# Patient Record
Sex: Female | Born: 1995 | Race: Black or African American | Hispanic: No | Marital: Single | Smoking: Never smoker
Health system: Southern US, Community
[De-identification: ages and names within clinical notes are randomized; demographics above are authoritative.]

## PROBLEM LIST (undated history)

## (undated) DIAGNOSIS — I10 Essential (primary) hypertension: Secondary | ICD-10-CM

## (undated) DIAGNOSIS — D649 Anemia, unspecified: Secondary | ICD-10-CM

## (undated) HISTORY — PX: TONSILLECTOMY: SHX5217

## (undated) HISTORY — DX: Anemia, unspecified: D64.9

## (undated) HISTORY — DX: Essential (primary) hypertension: I10

## (undated) HISTORY — PX: TONSILLECTOMY: SUR1361

---
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
## 2014-04-28 ENCOUNTER — Ambulatory Visit: Payer: Self-pay | Admitting: Physician Assistant

## 2014-04-28 LAB — RAPID STREP-A WITH REFLX: MICRO TEXT REPORT: NEGATIVE

## 2014-05-01 LAB — BETA STREP CULTURE(ARMC)

## 2014-08-08 IMAGING — CR DG CHEST 1V PORT
1 series · 1 of 1 positions shown · non-contrast
Comparison: March 30, 2012

CLINICAL DATA: Chest pain

EXAM:
PORTABLE CHEST - 1 VIEW

[ap]
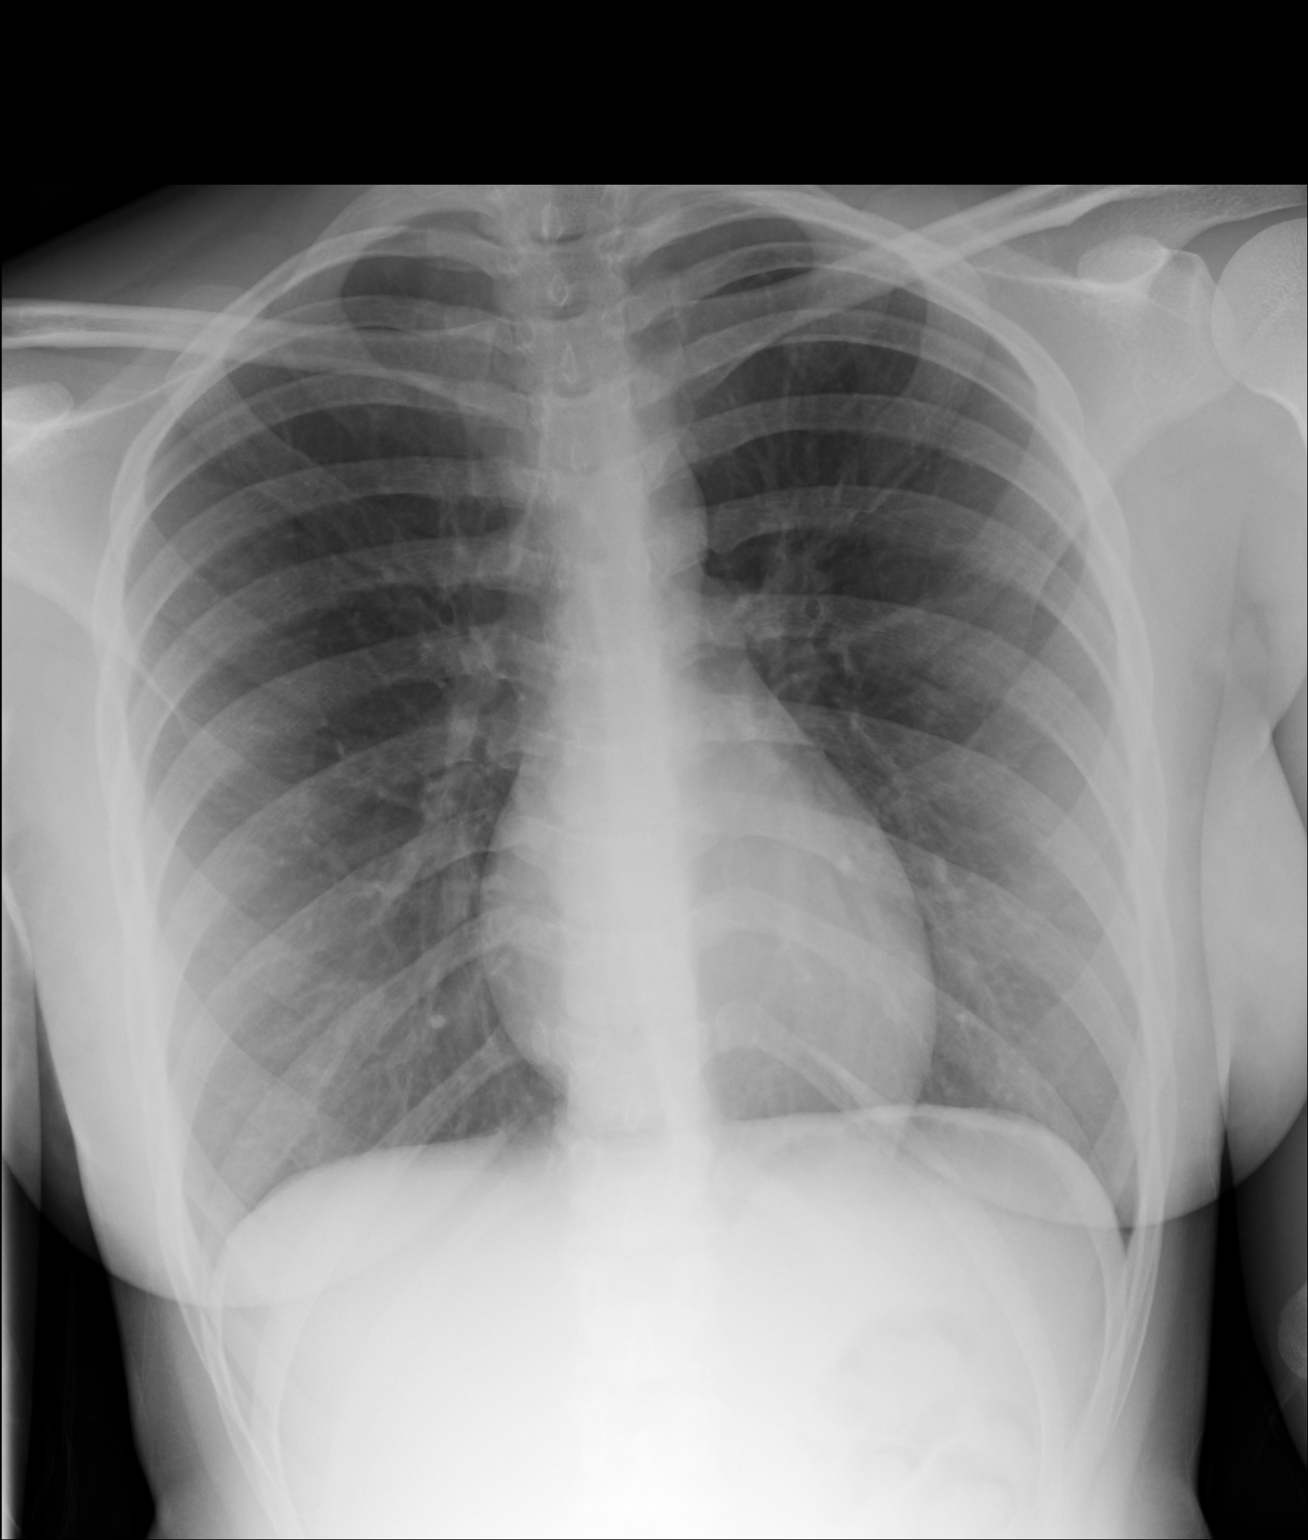

[1 of 1 positions shown; findings below may reference images not displayed]

FINDINGS: The heart size and mediastinal contours are within normal limits.
Both lungs are clear. There is no focal infiltrate, pulmonary edema,
or pleural effusion. The visualized skeletal structures are
unremarkable.
IMPRESSION: No active cardiopulmonary disease.

## 2015-03-19 ENCOUNTER — Encounter: Payer: Self-pay | Admitting: Physician Assistant

## 2015-03-19 ENCOUNTER — Ambulatory Visit (INDEPENDENT_AMBULATORY_CARE_PROVIDER_SITE_OTHER): Payer: Managed Care, Other (non HMO) | Admitting: Physician Assistant

## 2015-03-19 VITALS — BP 125/85 | HR 94 | Resp 18 | Ht 63.75 in | Wt 120.0 lb

## 2015-03-19 DIAGNOSIS — N912 Amenorrhea, unspecified: Secondary | ICD-10-CM

## 2015-03-19 DIAGNOSIS — Z3009 Encounter for other general counseling and advice on contraception: Secondary | ICD-10-CM | POA: Diagnosis not present

## 2015-03-19 DIAGNOSIS — Z113 Encounter for screening for infections with a predominantly sexual mode of transmission: Secondary | ICD-10-CM | POA: Diagnosis not present

## 2015-03-19 DIAGNOSIS — N9489 Other specified conditions associated with female genital organs and menstrual cycle: Secondary | ICD-10-CM

## 2015-03-19 DIAGNOSIS — N898 Other specified noninflammatory disorders of vagina: Secondary | ICD-10-CM

## 2015-03-19 LAB — POCT URINE PREGNANCY: Preg Test, Ur: NEGATIVE

## 2015-03-19 NOTE — Patient Instructions (Signed)
Intrauterine Device Information An intrauterine device (IUD) is inserted into your uterus to prevent pregnancy. There are two types of IUDs available:   Copper IUD--This type of IUD is wrapped in copper wire and is placed inside the uterus. Copper makes the uterus and fallopian tubes produce a fluid that kills sperm. The copper IUD can stay in place for 10 years.  Hormone IUD--This type of IUD contains the hormone progestin (synthetic progesterone). The hormone thickens the cervical mucus and prevents sperm from entering the uterus. It also thins the uterine lining to prevent implantation of a fertilized egg. The hormone can weaken or kill the sperm that get into the uterus. One type of hormone IUD can stay in place for 5 years, and another type can stay in place for 3 years. Your health care provider will make sure you are a good candidate for a contraceptive IUD. Discuss with your health care provider the possible side effects.  ADVANTAGES OF AN INTRAUTERINE DEVICE  IUDs are highly effective, reversible, long acting, and low maintenance.   There are no estrogen-related side effects.   An IUD can be used when breastfeeding.   IUDs are not associated with weight gain.   The copper IUD works immediately after insertion.   The hormone IUD works right away if inserted within 7 days of your period starting. You will need to use a backup method of birth control for 7 days if the hormone IUD is inserted at any other time in your cycle.  The copper IUD does not interfere with your female hormones.   The hormone IUD can make heavy menstrual periods lighter and decrease cramping.   The hormone IUD can be used for 3 or 5 years.   The copper IUD can be used for 10 years. DISADVANTAGES OF AN INTRAUTERINE DEVICE  The hormone IUD can be associated with irregular bleeding patterns.   The copper IUD can make your menstrual flow heavier and more painful.   You may experience cramping and  vaginal bleeding after insertion.    This information is not intended to replace advice given to you by your health care provider. Make sure you discuss any questions you have with your health care provider.   Document Released: 03/10/2004 Document Revised: 12/07/2012 Document Reviewed: 09/25/2012 Elsevier Interactive Patient Education 2016 Elsevier Inc.  

## 2015-03-19 NOTE — Progress Notes (Signed)
Patient ID: Janet GulaKiandra Lashae Villanueva, female   DOB: 07-03-1995, 19 y.o.   MRN: [id number] History:  Janet Villanueva is a 19 y.o. G0P0000 who presents to clinic today for evaluation of odor and contraception.  She is presently not using any contraception.  Her cycles are generally regular.  She has had unprotected IC within the last 48 hours.  She started noticing the odor a couple of days ago.  She has not noticed vaginal itching or discharge.    She wants to avoid pregnancy AND irregular bleeding.  She did not previously like Depo because of the bleeding.  She cannot remember to use a pill daily.    She denies weakness, fever, headache, abdominal pain, vaginal bleeding.  The following portions of the patient's history were reviewed and updated as appropriate: allergies, current medications, past family history, past medical history, past social history, past surgical history and problem list.  Review of Systems:  Pertinent items noted in HPI and remainder of comprehensive ROS otherwise negative.  Objective:  Physical Exam BP 125/85 mmHg  Pulse 94  Resp 18  Ht 5' 3.75" (1.619 m)  Wt 120 lb (54.432 kg)  BMI 20.77 kg/m2  LMP 02/13/2015 GENERAL: Well-developed, well-nourished female in no acute distress.  HEENT: Normocephalic, atraumatic.  RESPIRATORY: Normal rate. No resp distress.  Normal effort. CARDIOVASCULAR: Regular rate ABDOMEN: Soft, nontender, nondistended. No organomegaly.  PELVIC: Normal external female genitalia. Vagina is pink and rugated.  Homogenous white discharge. Normal cervix contour without erythema or mucopurulent discharge. Uterus is normal in size. No adnexal mass or tenderness.  EXTREMITIES: No cyanosis, clubbing, or edema, 2+ distal pulses. PSYCH: Normal behavior, normal mood   Labs and Imaging No results found.  Pregnancy Test negative  Assessment & Plan:  Assessment: Contraceptive counselling Vaginal discharge with odor amenorrhea  Plans: Negative  pregnancy test today - will return in 2 weeks for second pregnancy test Wet prep, GC, Chlamydia all pending.  Will call with results/treatment. Pt to return in 2 weeks for IUD insertion.   Mirena info provided.  Pt must have no unprotected sex.  She is advised to use condoms for all IC.    Bertram DenverKaren E Teague Clark, PA-C 03/19/2015 1:28 PM

## 2015-03-21 LAB — GC/CHLAMYDIA PROBE AMP
CHLAMYDIA, DNA PROBE: NEGATIVE
Neisseria gonorrhoeae by PCR: NEGATIVE

## 2015-03-21 LAB — WET PREP, GENITAL

## 2015-03-26 ENCOUNTER — Ambulatory Visit: Payer: Managed Care, Other (non HMO) | Admitting: Obstetrics & Gynecology

## 2015-04-18 ENCOUNTER — Ambulatory Visit (INDEPENDENT_AMBULATORY_CARE_PROVIDER_SITE_OTHER): Payer: Managed Care, Other (non HMO) | Admitting: Unknown Physician Specialty

## 2015-04-18 ENCOUNTER — Encounter: Payer: Self-pay | Admitting: Unknown Physician Specialty

## 2015-04-18 VITALS — BP 138/87 | HR 82 | Temp 98.9°F | Ht 61.8 in | Wt 119.0 lb

## 2015-04-18 DIAGNOSIS — N9489 Other specified conditions associated with female genital organs and menstrual cycle: Secondary | ICD-10-CM

## 2015-04-18 DIAGNOSIS — Z3009 Encounter for other general counseling and advice on contraception: Secondary | ICD-10-CM

## 2015-04-18 DIAGNOSIS — N898 Other specified noninflammatory disorders of vagina: Secondary | ICD-10-CM

## 2015-04-18 LAB — UA/M W/RFLX CULTURE, ROUTINE
BILIRUBIN UA: NEGATIVE
Glucose, UA: NEGATIVE
KETONES UA: NEGATIVE
LEUKOCYTES UA: NEGATIVE
NITRITE UA: NEGATIVE
PH UA: 7 (ref 5.0–7.5)
RBC UA: NEGATIVE
Specific Gravity, UA: 1.02 (ref 1.005–1.030)
UUROB: 0.2 mg/dL (ref 0.2–1.0)

## 2015-04-18 LAB — MICROSCOPIC EXAMINATION: WBC, UA: NONE SEEN /hpf (ref 0–?)

## 2015-04-18 LAB — PREGNANCY, URINE: PREG TEST UR: NEGATIVE

## 2015-04-18 LAB — WET PREP FOR TRICH, YEAST, CLUE
Clue Cell Exam: POSITIVE — AB
TRICHOMONAS EXAM: NEGATIVE
Yeast Exam: NEGATIVE

## 2015-04-18 MED ORDER — MEDROXYPROGESTERONE ACETATE 150 MG/ML IM SUSP
150.0000 mg | Freq: Once | INTRAMUSCULAR | Status: AC
  Administered 2015-04-23: 150 mg via INTRAMUSCULAR

## 2015-04-18 MED ORDER — METRONIDAZOLE 500 MG PO TABS: 500.0000 mg | ORAL_TABLET | Freq: Two times a day (BID) | ORAL | Status: DC

## 2015-04-18 NOTE — Progress Notes (Signed)
+------------------------------++++  BP 138/87 mmHg  Pulse 82  Temp(Src) 98.9 F (37.2 C)  Ht 5' 1.8" (1.57 m)  Wt 119 lb (53.978 kg)  BMI 21.90 kg/m2  SpO2 97%  LMP 03/22/2015 (Exact Date)   Subjective:    Patient ID: Janet Villanueva, female    DOB: 1995-05-09, 19 y.o.   MRN: [id number]  HPI: Janet GulaKiandra Lashae Cleek is a 19 y.o. female  Chief Complaint  Patient presents with  . Urinary Tract Infection    pt states she has had some vaginal odor that has happened in the past but has recently started again.  . Injections    pt wants to start back on depo, last was Spetember of 2014   Vaginitis _1-2 day history of white discharge and vaginal odor.  These are similar symptoms that she has had in the past.    HPI   Relevant past medical, surgical, family and social history reviewed and updated as indicated. Interim medical history since our last visit reviewed. Allergies and medications reviewed and updated.  Review of Systems  Per HPI unless specifically indicated above     Objective:    BP 138/87 mmHg  Pulse 82  Temp(Src) 98.9 F (37.2 C)  Ht 5' 1.8" (1.57 m)  Wt 119 lb (53.978 kg)  BMI 21.90 kg/m2  SpO2 97%  LMP 03/22/2015 (Exact Date)  Wt Readings from Last 3 Encounters:  04/18/15 119 lb (53.978 kg)  10/11/12 115 lb (52.164 kg) (31 %*, Z = -0.48)  03/19/15 120 lb (54.432 kg)   * Growth percentiles are based on CDC 2-20 Years data.    Physical Exam  Constitutional: She is oriented to person, place, and time. She appears well-developed and well-nourished. No distress.  HENT:  Head: Normocephalic and atraumatic.  Eyes: Conjunctivae and lids are normal. Right eye exhibits no discharge. Left eye exhibits no discharge. No scleral icterus.  Cardiovascular: Normal rate.   Pulmonary/Chest: Effort normal.  Abdominal: Normal appearance. There is no splenomegaly or hepatomegaly.  Genitourinary: There is erythema in the vagina. Vaginal discharge found.   Musculoskeletal: Normal range of motion.  Neurological: She is alert and oriented to person, place, and time.  Skin: Skin is intact. No rash noted. No pallor.  Psychiatric: She has a normal mood and affect. Her behavior is normal. Judgment and thought content normal.    Results for orders placed or performed in visit on 03/19/15  Wet prep, genital  Result Value Ref Range   Trichomonas Exam CANCELED   GC/Chlamydia Probe Amp  Result Value Ref Range   Chlamydia trachomatis, NAA Negative Negative   Neisseria gonorrhoeae by PCR Negative Negative  POCT urine pregnancy  Result Value Ref Range   Preg Test, Ur Negative Negative      Assessment & Plan:   Problem List Items Addressed This Visit    None    Visit Diagnoses    Vaginal odor    -  Primary    Relevant Orders    UA/M w/rflx Culture, Routine    WET PREP FOR TRICH, YEAST, CLUE    Pregnancy, urine    Family planning counseling        Start depo provera today.  Schedule PE    Relevant Medications    medroxyPROGESTERone (DEPO-PROVERA) injection 150 mg (Start on 04/18/2015  4:30 PM)    Other Relevant Orders    Pregnancy, urine        Follow up plan: Return for schedule physical.

## 2015-04-19 ENCOUNTER — Telehealth: Payer: Self-pay

## 2015-04-19 NOTE — Telephone Encounter (Signed)
Gina asked me if I could call [facility] about patients flagyl. Pharmacy said they haven't received anything. I called pharmacy and the pharmacist said that the patient has never filled here before. [facility] and sometimes [facility] get [facility].  I called the patient and she did in fact [facility].   [facility]-take 1 tablet by mouth 2 times daily with 0 refills.

## 2015-04-23 ENCOUNTER — Ambulatory Visit (INDEPENDENT_AMBULATORY_CARE_PROVIDER_SITE_OTHER): Payer: Managed Care, Other (non HMO)

## 2015-04-23 ENCOUNTER — Telehealth: Payer: Self-pay

## 2015-04-23 DIAGNOSIS — Z3009 Encounter for other general counseling and advice on contraception: Secondary | ICD-10-CM

## 2015-04-23 DIAGNOSIS — Z3042 Encounter for surveillance of injectable contraceptive: Secondary | ICD-10-CM

## 2015-04-23 NOTE — Telephone Encounter (Signed)
Patient was supposed to get depo injection at visit last week but we were out and we just got some in late Friday afternoon. Called and scheduled patient's depo injection for this afternoon.

## 2015-05-20 ENCOUNTER — Encounter: Payer: Self-pay | Admitting: Unknown Physician Specialty

## 2015-05-20 ENCOUNTER — Ambulatory Visit (INDEPENDENT_AMBULATORY_CARE_PROVIDER_SITE_OTHER): Payer: Managed Care, Other (non HMO) | Admitting: Unknown Physician Specialty

## 2015-05-20 VITALS — BP 139/87 | HR 82 | Temp 98.4°F | Ht 62.5 in | Wt 117.2 lb

## 2015-05-20 DIAGNOSIS — Z3042 Encounter for surveillance of injectable contraceptive: Secondary | ICD-10-CM | POA: Diagnosis not present

## 2015-05-20 DIAGNOSIS — K589 Irritable bowel syndrome without diarrhea: Secondary | ICD-10-CM

## 2015-05-20 HISTORY — DX: Irritable bowel syndrome, unspecified: K58.9

## 2015-05-20 MED ORDER — MEDROXYPROGESTERONE ACETATE 150 MG/ML IM SUSP
150.0000 mg | INTRAMUSCULAR | Status: AC
  Administered 2015-08-30: 150 mg via INTRAMUSCULAR

## 2015-05-20 NOTE — Assessment & Plan Note (Signed)
Ordered depo for one year

## 2015-05-20 NOTE — Addendum Note (Signed)
Addended by: Gabriel Cirri on: 05/20/2015 02:27 PM   Modules accepted: Kipp Brood

## 2015-05-20 NOTE — Progress Notes (Signed)
BP 139/87 mmHg  Pulse 82  Temp(Src) 98.4 F (36.9 C)  Ht 5' 2.5" (1.588 m)  Wt 117 lb 3.2 oz (53.162 kg)  BMI 21.08 kg/m2  SpO2 98%   Subjective:    Patient ID: Janet Villanueva, female    DOB: Apr 21, 1995, 19 y.o.   MRN: [id number]  HPI: Janet Villanueva is a 21 y.o. female  Chief Complaint  Patient presents with  . Annual Exam   Pt is here for an annual physicla  Relevant past medical, surgical, family and social history reviewed and updated as indicated. Interim medical history since our last visit reviewed. Allergies and medications reviewed and updated.  Review of Systems  Constitutional: Negative.   HENT: Negative.   Eyes: Negative.   Respiratory: Negative.   Cardiovascular: Negative.   Gastrointestinal: Positive for abdominal pain and diarrhea.       Intermittent abdominal pain relieved by diarrhea  Endocrine: Negative.   Genitourinary: Negative.   Musculoskeletal: Negative.   Skin: Negative.   Allergic/Immunologic: Negative.   Neurological: Negative.   Hematological: Negative.   Psychiatric/Behavioral: Negative.     Per HPI unless specifically indicated above     Objective:    BP 139/87 mmHg  Pulse 82  Temp(Src) 98.4 F (36.9 C)  Ht 5' 2.5" (1.588 m)  Wt 117 lb 3.2 oz (53.162 kg)  BMI 21.08 kg/m2  SpO2 98%  Wt Readings from Last 3 Encounters:  05/20/15 117 lb 3.2 oz (53.162 kg)  04/18/15 119 lb (53.978 kg)  10/11/12 115 lb (52.164 kg) (31 %*, Z = -0.48)   * Growth percentiles are based on CDC 2-20 Years data.    Physical Exam  Constitutional: She is oriented to person, place, and time. She appears well-developed and well-nourished.  HENT:  Head: Normocephalic and atraumatic.  Eyes: Pupils are equal, round, and reactive to light. Right eye exhibits no discharge. Left eye exhibits no discharge. No scleral icterus.  Neck: Normal range of motion. Neck supple. Carotid bruit is not present. No thyromegaly present.  Cardiovascular:  Normal rate, regular rhythm and normal heart sounds.  Exam reveals no gallop and no friction rub.   No murmur heard. Pulmonary/Chest: Effort normal and breath sounds normal. No respiratory distress. She has no wheezes. She has no rales.  Abdominal: Soft. Bowel sounds are normal. There is no tenderness. There is no rebound.  Genitourinary: No breast swelling, tenderness or discharge.  Musculoskeletal: Normal range of motion.  Lymphadenopathy:    She has no cervical adenopathy.  Neurological: She is alert and oriented to person, place, and time.  Skin: Skin is warm, dry and intact. No rash noted.  Psychiatric: She has a normal mood and affect. Her speech is normal and behavior is normal. Judgment and thought content normal. Cognition and memory are normal.    Results for orders placed or performed in visit on 04/18/15  Microscopic Examination  Result Value Ref Range   WBC, UA None seen 0 -  5 /hpf   RBC, UA 0-2 0 -  2 /hpf   Epithelial Cells (non renal) 0-10 0 - 10 /hpf   Mucus, UA Present Not Estab.   Bacteria, UA Few None seen/Few  WET PREP FOR TRICH, YEAST, CLUE  Result Value Ref Range   Trichomonas Exam Negative Negative   Yeast Exam Negative Negative   Clue Cell Exam Positive (A) Negative  UA/M w/rflx Culture, Routine  Result Value Ref Range   Specific Gravity, UA 1.020 1.005 -  1.030   pH, UA 7.0 5.0 - 7.5   Color, UA Yellow Yellow   Appearance Ur Cloudy (A) Clear   Leukocytes, UA Negative Negative   Protein, UA 2+ (A) Negative/Trace   Glucose, UA Negative Negative   Ketones, UA Negative Negative   RBC, UA Negative Negative   Bilirubin, UA Negative Negative   Urobilinogen, Ur 0.2 0.2 - 1.0 mg/dL   Nitrite, UA Negative Negative   Microscopic Examination See below:   Pregnancy, urine  Result Value Ref Range   Preg Test, Ur Negative Negative      Assessment & Plan:   Problem List Items Addressed This Visit      Unprioritized   Contraception management - Primary     Ordered depo for one year      Relevant Medications   medroxyPROGESTERone (DEPO-PROVERA) injection 150 mg (Start on 06/25/2015 12:00 AM)   IBS (irritable bowel syndrome)       Follow up plan: Return in about 1 year (around 05/19/2016).

## 2015-05-20 NOTE — Patient Instructions (Signed)
Irritable Bowel Syndrome, Adult Irritable bowel syndrome (IBS) is not one specific disease. It is a group of symptoms that affects the organs responsible for digestion (gastrointestinal or GI tract).  To regulate how your GI tract works, your body sends signals back and forth between your intestines and your brain. If you have IBS, there may be a problem with these signals. As a result, your GI tract does not function normally. Your intestines may become more sensitive and overreact to certain things. This is especially true when you eat certain foods or when you are under stress.  There are four types of IBS. These may be determined based on the consistency of your stool:   IBS with diarrhea.   IBS with constipation.   Mixed IBS.   Unsubtyped IBS.  It is important to know which type of IBS you have. Some treatments are more likely to be helpful for certain types of IBS.  CAUSES  The exact cause of IBS is not known. RISK FACTORS You may have a higher risk of IBS if:  You are a woman.  You are younger than 21 years old.  You have a family history of IBS.  You have mental health problems.  You have had bacterial infection of your GI tract. SIGNS AND SYMPTOMS  Symptoms of IBS vary from person to person. The main symptom is abdominal pain or discomfort. Additional symptoms usually include one or more of the following:   Diarrhea, constipation, or both.   Abdominal swelling or bloating.   Feeling full or sick after eating a small or regular-size meal.   Frequent gas.   Mucus in the stool.   A feeling of having more stool left after a bowel movement.  Symptoms tend to come and go. They may be associated with stress, psychiatric conditions, or nothing at all.  DIAGNOSIS  There is no specific test to diagnose IBS. Your health care provider will make a diagnosis based on a physical exam, medical history, and your symptoms. You may have other tests to rule out other  conditions that may be causing your symptoms. These may include:   Blood tests.   X-rays.   CT scan.  Endoscopy and colonoscopy. This is a test in which your GI tract is viewed with a long, thin, flexible tube. TREATMENT There is no cure for IBS, but treatment can help relieve symptoms. IBS treatment often includes:   Changes to your diet, such as:  Eating more fiber.  Avoiding foods that cause symptoms.  Drinking more water.  Eating regular, medium-sized portioned meals.  Medicines. These may include:  Fiber supplements if you have constipation.  Medicine to control diarrhea (antidiarrheal medicines).  Medicine to help control muscle spasms in your GI tract (antispasmodic medicines).  Medicines to help with any mental health issues, such as antidepressants or tranquilizers.  Therapy.  Talk therapy may help with anxiety, depression, or other mental health issues that can make IBS symptoms worse.  Stress reduction.  Managing your stress can help keep symptoms under control. HOME CARE INSTRUCTIONS   Take medicines only as directed by your health care provider.  Eat a healthy diet.  Avoid foods and drinks with added sugar.  Include more whole grains, fruits, and vegetables gradually into your diet. This may be especially helpful if you have IBS with constipation.  Avoid any foods and drinks that make your symptoms worse. These may include dairy products and caffeinated or carbonated drinks.  Do not eat large meals.    Drink enough fluid to keep your urine clear or pale yellow.  Exercise regularly. Ask your health care provider for recommendations of good activities for you.  Keep all follow-up visits as directed by your health care provider. This is important. SEEK MEDICAL CARE IF:   You have constant pain.  You have trouble or pain with swallowing.  You have worsening diarrhea. SEEK IMMEDIATE MEDICAL CARE IF:   You have severe and worsening abdominal  pain.   You have diarrhea and:   You have a rash, stiff neck, or severe headache.   You are irritable, sleepy, or difficult to awaken.   You are weak, dizzy, or extremely thirsty.   You have bright red blood in your stool or you have black tarry stools.   You have unusual abdominal swelling that is painful.   You vomit continuously.   You vomit blood (hematemesis).   You have both abdominal pain and a fever.    This information is not intended to replace advice given to you by your health care provider. Make sure you discuss any questions you have with your health care provider.   Document Released: 04/06/2005 Document Revised: 04/27/2014 Document Reviewed: 12/22/2013 Elsevier Interactive Patient Education 2016 Elsevier Inc.  

## 2015-07-09 ENCOUNTER — Ambulatory Visit: Payer: Self-pay

## 2015-08-28 ENCOUNTER — Encounter: Payer: Self-pay | Admitting: Emergency Medicine

## 2015-08-28 ENCOUNTER — Ambulatory Visit
Admission: EM | Admit: 2015-08-28 | Discharge: 2015-08-28 | Disposition: A | Discharge status: Home / Self Care | Payer: Managed Care, Other (non HMO) | Attending: Family Medicine | Admitting: Family Medicine

## 2015-08-28 ENCOUNTER — Other Ambulatory Visit: Payer: Self-pay | Admitting: Unknown Physician Specialty

## 2015-08-28 DIAGNOSIS — J029 Acute pharyngitis, unspecified: Secondary | ICD-10-CM | POA: Diagnosis not present

## 2015-08-28 LAB — RAPID STREP SCREEN (MED CTR MEBANE ONLY): Streptococcus, Group A Screen (Direct): NEGATIVE

## 2015-08-28 LAB — PREGNANCY, URINE: PREG TEST UR: NEGATIVE

## 2015-08-28 MED ORDER — AMOXICILLIN 500 MG PO CAPS: 500.0000 mg | ORAL_CAPSULE | Freq: Two times a day (BID) | ORAL | Status: DC

## 2015-08-28 NOTE — ED Notes (Signed)
Patient c/o sore throat and HAs for the past 3 days.  Patient reports fevers.

## 2015-08-28 NOTE — ED Provider Notes (Signed)
CSN: [id number]     Arrival date & time 08/28/15  1320 History   First MD Initiated Contact with Patient 08/28/15 1343     Chief Complaint  Patient presents with  . Sore Throat   (Consider location/radiation/quality/duration/timing/severity/associated sxs/prior Treatment) HPI: Patient presents today with symptoms of sore throat and headache for the last 3 days. Patient states that she has a history of strep pharyngitis. She has had her tonsils removed. She also has had low-grade fever. She denies any cough, nasal congestion, extreme fatigue, abdominal pain, urinary symptoms, nausea, vomiting, diarrhea. LMP was in March. Patient states that she had the Depo shot earlier in the year but did not go back to have another shot after 3 months. She is sexually active.  Past Medical History  Diagnosis Date  . Anemia    Past Surgical History  Procedure Laterality Date  . Tonsillectomy    . Tonsillectomy     Family History  Problem Relation Age of Onset  . Cancer Maternal Grandmother     breast  . Diabetes Maternal Grandmother   . Hypertension Maternal Grandfather   . Thyroid disease Mother   . Asthma Sister   . Asthma Brother    Social History  Substance Use Topics  . Smoking status: Never Smoker   . Smokeless tobacco: Never Used  . Alcohol Use: No   OB History    Gravida Para Term Preterm AB TAB SAB Ectopic Multiple Living   0 0 0 0 0 0 0 0 0 0      Review of Systems: Negative except mentioned above.   Allergies  Review of patient's allergies indicates no known allergies.  Home Medications   Prior to Admission medications   Medication Sig Start Date End Date Taking? Authorizing Provider  metroNIDAZOLE (FLAGYL) 500 MG tablet TAKE 1 TABLET BY MOUTH TWICE A DAY 08/28/15   Gabriel Cirriheryl Wicker, NP   Meds Ordered and Administered this Visit  Medications - No data to display  BP 126/83 mmHg  Pulse 99  Temp(Src) 99.3 F (37.4 C) (Tympanic)  Resp 16  Ht 5\' 4"  (1.626 m)  Wt 120 lb  (54.432 kg)  BMI 20.59 kg/m2  SpO2 100% No data found.   Physical Exam   GENERAL: NAD HEENT: moderate pharyngeal erythema, no exudate, no erythema of TMs, mild cervical LAD RESP: CTA B CARD: RRR ABD: +BS, NT, no organomegly appreciated  NEURO: CN II-XII grossly intact   ED Course  Procedures (including critical care time)  Labs Review Labs Reviewed  RAPID STREP SCREEN (NOT [facility])  CULTURE, GROUP A STREP [facility])    Imaging Review No results found.    MDM  A/P: Pharyngitis-rapid strep test was negative, throat culture sent, will treat patient with amoxicillin, Tylenol/Motrin when necessary, rest, hydration, seek medical attention if symptoms persist or worsen as discussed. Urine pregnancy was done which was negative.   Jolene ProvostKirtida Ronnell Makarewicz, MD 08/28/15 1452

## 2015-08-30 ENCOUNTER — Other Ambulatory Visit: Payer: Self-pay

## 2015-08-30 ENCOUNTER — Ambulatory Visit (INDEPENDENT_AMBULATORY_CARE_PROVIDER_SITE_OTHER): Payer: Managed Care, Other (non HMO)

## 2015-08-30 DIAGNOSIS — Z308 Encounter for other contraceptive management: Secondary | ICD-10-CM | POA: Diagnosis not present

## 2015-08-30 DIAGNOSIS — Z3042 Encounter for surveillance of injectable contraceptive: Secondary | ICD-10-CM

## 2015-08-30 LAB — CULTURE, GROUP A STREP (THRC)

## 2015-08-30 LAB — PREGNANCY, URINE: Preg Test, Ur: NEGATIVE

## 2015-09-30 ENCOUNTER — Encounter: Payer: Self-pay | Admitting: Gynecology

## 2015-09-30 ENCOUNTER — Ambulatory Visit
Admission: EM | Admit: 2015-09-30 | Discharge: 2015-09-30 | Disposition: A | Discharge status: Home / Self Care | Payer: Managed Care, Other (non HMO) | Attending: Family Medicine | Admitting: Family Medicine

## 2015-09-30 DIAGNOSIS — J020 Streptococcal pharyngitis: Secondary | ICD-10-CM | POA: Diagnosis not present

## 2015-09-30 LAB — MONONUCLEOSIS SCREEN: Mono Screen: NEGATIVE

## 2015-09-30 LAB — RAPID STREP SCREEN (MED CTR MEBANE ONLY): Streptococcus, Group A Screen (Direct): POSITIVE — AB

## 2015-09-30 MED ORDER — PENICILLIN G BENZATHINE 1200000 UNIT/2ML IM SUSP
1.2000 10*6.[IU] | Freq: Once | INTRAMUSCULAR | Status: AC
  Administered 2015-09-30: 1.2 10*6.[IU] via INTRAMUSCULAR

## 2015-09-30 NOTE — ED Provider Notes (Signed)
CSN: [id number]     Arrival date & time 09/30/15  1504 History   First MD Initiated Contact with Patient 09/30/15 1638     Chief Complaint  Patient presents with  . Sore Throat   (Consider location/radiation/quality/duration/timing/severity/associated sxs/prior Treatment) HPI  This a 20 year old female presents with sore throat headache and fever. He states that her fever has been as high as 101 earlier today at the present time was 99 2. She had been seen here on 08/28/2015 complaining of the same symptoms and she states that she feels the same this time as she did then. Her symptoms started yesterday. He states that after her treatment with amoxicillin at her last visit she did improve after 2 days but has now since returned. Not been around any sick individuals to her knowledge. She works at [employer].      Past Medical History  Diagnosis Date  . Anemia    Past Surgical History  Procedure Laterality Date  . Tonsillectomy    . Tonsillectomy     Family History  Problem Relation Age of Onset  . Cancer Maternal Grandmother     breast  . Diabetes Maternal Grandmother   . Hypertension Maternal Grandfather   . Thyroid disease Mother   . Asthma Sister   . Asthma Brother    Social History  Substance Use Topics  . Smoking status: Never Smoker   . Smokeless tobacco: Never Used  . Alcohol Use: No   OB History    Gravida Para Term Preterm AB TAB SAB Ectopic Multiple Living       Review of Systems  Constitutional: Positive for fever, chills, activity change and fatigue.  HENT: Positive for sore throat. Negative for congestion, ear pain, postnasal drip and rhinorrhea.   Respiratory: Negative for cough and shortness of breath.   All other systems reviewed and are negative.   Allergies  Review of patient's allergies indicates no known allergies.  Home Medications   Prior to Admission medications   Medication Sig Start Date End Date Taking? Authorizing  Provider  amoxicillin (AMOXIL) 500 MG capsule Take 1 capsule (500 mg total) by mouth 2 (two) times daily. 08/28/15   Jolene Provost, MD  metroNIDAZOLE (FLAGYL) 500 MG tablet TAKE 1 TABLET BY MOUTH TWICE A DAY 08/28/15   Gabriel Cirri, NP   Meds Ordered and Administered this Visit   Medications  penicillin g benzathine (BICILLIN LA) 1200000 UNIT/2ML injection 1.2 Million Units (1.2 Million Units Intramuscular Given 09/30/15 1730)    BP 125/70 mmHg  Pulse 106  Temp(Src) 99.2 F (37.3 C) (Oral)  Resp 16  Ht  (1.626 m)  Wt 120 lb (54.432 kg)  BMI 20.59 kg/m2  SpO2 100%  LMP  (LMP Unknown) No data found.   Physical Exam  Constitutional: She is oriented to person, place, and time. She appears well-developed and well-nourished. No distress.  HENT:  Head: Normocephalic and atraumatic.  Right Ear: External ear normal.  Left Ear: External ear normal.  Nose: Nose normal.  Mouth/Throat: Oropharynx is clear and moist. No oropharyngeal exudate.  Eyes: Conjunctivae are normal. Pupils are equal, round, and reactive to light.  Neck: Normal range of motion. Neck supple.  Pulmonary/Chest: Effort normal and breath sounds normal.  Musculoskeletal: Normal range of motion. She exhibits no edema or tenderness.  Lymphadenopathy:    She has no cervical adenopathy.  Neurological: She is alert and oriented to person, place,  and time.  Skin: Skin is warm and dry. She is not diaphoretic.  Psychiatric: She has a normal mood and affect. Her behavior is normal. Judgment and thought content normal.  Nursing note and vitals reviewed.   ED Course  Procedures (including critical care time)  Labs Review Labs Reviewed  RAPID STREP SCREEN (NOT AT [facility]) - Abnormal; Notable for the following:    Streptococcus, Group A Screen (Direct) POSITIVE (*)    All other components within normal limits  MONONUCLEOSIS SCREEN    Imaging Review No results found.   Visual Acuity Review  Right Eye Distance:    Left Eye Distance:   Bilateral Distance:    Right Eye Near:   Left Eye Near:    Bilateral Near:     Medications  penicillin g benzathine (BICILLIN LA) 1200000 UNIT/2ML injection 1.2 Million Units (1.2 Million Units Intramuscular Given 09/30/15 1730)      MDM   1. Strep pharyngitis    Plan: 1. Test/x-ray results and diagnosis reviewed with patient 2. rx as per orders; risks, benefits, potential side effects reviewed with patient 3. Recommend supportive treatment with Salt water gargles as necessary. She can use Tylenol or ibuprofen for inflammation. If she has any further problems she can return here or should be seen by her primary care physician. 4. F/u prn if symptoms worsen or don't improve     Lutricia FeilWilliam P Daphanie Oquendo, PA-C 09/30/15 1831

## 2015-09-30 NOTE — ED Notes (Signed)
Patient c/o sore throat / head ache /fever. Per patient same symptoms she had when she was here on 08/28/15. Per patient symptoms started yesterday.

## 2015-09-30 NOTE — Discharge Instructions (Signed)
Rapid Strep Test °Strep throat is a bacterial infection caused by the bacteria Streptococcus pyogenes. A rapid strep test is the quickest way to check if these bacteria are causing your sore throat. The test can be done at your health care provider's office. Results are usually ready in 10-20 minutes. °You may have this test if you have symptoms of strep throat. These include:  °· A red throat with yellow or white spots. °· Neck swelling and tenderness. °· Fever. °· Loss of appetite. °· Trouble breathing or swallowing. °· Rash. °· Dehydration. °This test requires a sample of fluid from the back of your throat and tonsils. Your health care provider may hold down your tongue with a tongue depressor and use a swab to collect the sample.  °Your health care provider may collect a second sample at the same time. The second sample may be used for a throat culture. In a culture test, the sample is combined with a substance that encourages bacteria to grow. It takes longer to get the results of the throat culture test, but they are more accurate. They can confirm the results from a rapid strep test, or show that those results were wrong. °RESULTS  °It is your responsibility to obtain your test results. Ask the lab or department performing the test when and how you will get your results. Contact your health care provider to discuss any questions you have about your results.  °The results of the rapid strep test will be negative or positive.  °Meaning of Negative Test Results °If the result of your rapid strep test is negative, then it means:  °· It is likely that you do not have strep throat. °· A virus may be causing your sore throat. °Your health care provider may do a throat culture to confirm the results of the rapid strep test. The throat culture can also identify the different strains of strep bacteria. °Meaning of Positive Test Results °If the result of your rapid strep test is positive, then it means: °· It is likely  that you do have strep throat. °· You may have to take antibiotics. °Your health care provider may do a throat culture to confirm the results of the rapid strep test. Strep throat usually requires a course of antibiotics.  °  °This information is not intended to replace advice given to you by your health care provider. Make sure you discuss any questions you have with your health care provider. °  °Document Released: 05/14/2004 Document Revised: 04/27/2014 Document Reviewed: 07/13/2013 °Elsevier Interactive Patient Education ©2016 [employer] ° °Strep Throat °Strep throat is a bacterial infection of the throat. Your health care provider may call the infection tonsillitis or pharyngitis, depending on whether there is swelling in the tonsils or at the back of the throat. Strep throat is most common during the cold months of the year in children who are 5-15 years of age, but it can happen during any season in people of any age. This infection is spread from person to person (contagious) through coughing, sneezing, or close contact. °CAUSES °Strep throat is caused by the bacteria called Streptococcus pyogenes. °RISK FACTORS °This condition is more likely to develop in: °· People who spend time in crowded places where the infection can spread easily. °· People who have close contact with someone who has strep throat. °SYMPTOMS °Symptoms of this condition include: °· Fever or chills.   °· Redness, swelling, or pain in the tonsils or throat. °· Pain or difficulty when   swallowing. °· White or yellow spots on the tonsils or throat. °· Swollen, tender glands in the neck or under the jaw. °· Red rash all over the body (rare). °DIAGNOSIS °This condition is diagnosed by performing a rapid strep test or by taking a swab of your throat (throat culture test). Results from a rapid strep test are usually ready in a few minutes, but throat culture test results are available after one or two days. °TREATMENT °This condition is  treated with antibiotic medicine. °HOME CARE INSTRUCTIONS °Medicines °· Take over-the-counter and prescription medicines only as told by your health care provider. °· Take your antibiotic as told by your health care provider. Do not stop taking the antibiotic even if you start to feel better. °· Have family members who also have a sore throat or fever tested for strep throat. They may need antibiotics if they have the strep infection. °Eating and Drinking °· Do not share food, drinking cups, or personal items that could cause the infection to spread to other people. °· If swallowing is difficult, try eating soft foods until your sore throat feels better. °· Drink enough fluid to keep your urine clear or pale yellow. °General Instructions °· Gargle with a salt-water mixture 3-4 times per day or as needed. To make a salt-water mixture, completely dissolve ½-1 tsp of salt in 1 cup of warm water. °· Make sure that all household members wash their hands well. °· Get plenty of rest. °· Stay home from school or work until you have been taking antibiotics for 24 hours. °· Keep all follow-up visits as told by your health care provider. This is important. °SEEK MEDICAL CARE IF: °· The glands in your neck continue to get bigger. °· You develop a rash, cough, or earache. °· You cough up a thick liquid that is green, yellow-brown, or bloody. °· You have pain or discomfort that does not get better with medicine. °· Your problems seem to be getting worse rather than better. °· You have a fever. °SEEK IMMEDIATE MEDICAL CARE IF: °· You have new symptoms, such as vomiting, severe headache, stiff or painful neck, chest pain, or shortness of breath. °· You have severe throat pain, drooling, or changes in your voice. °· You have swelling of the neck, or the skin on the neck becomes red and tender. °· You have signs of dehydration, such as fatigue, dry mouth, and decreased urination. °· You become increasingly sleepy, or you cannot wake  up completely. °· Your joints become red or painful. °  °This information is not intended to replace advice given to you by your health care provider. Make sure you discuss any questions you have with your health care provider. °  °Document Released: 04/03/2000 Document Revised: 12/26/2014 Document Reviewed: 07/30/2014 °Elsevier Interactive Patient Education ©2016 Elsevier Inc. ° °

## 2015-10-01 ENCOUNTER — Ambulatory Visit: Payer: Managed Care, Other (non HMO) | Admitting: Unknown Physician Specialty

## 2015-10-02 ENCOUNTER — Ambulatory Visit: Payer: Managed Care, Other (non HMO) | Admitting: Unknown Physician Specialty

## 2015-10-08 ENCOUNTER — Ambulatory Visit: Payer: Managed Care, Other (non HMO) | Admitting: Unknown Physician Specialty

## 2015-12-06 ENCOUNTER — Ambulatory Visit: Payer: Managed Care, Other (non HMO) | Admitting: Unknown Physician Specialty

## 2015-12-09 ENCOUNTER — Encounter: Payer: Self-pay | Admitting: Unknown Physician Specialty

## 2015-12-09 ENCOUNTER — Ambulatory Visit (INDEPENDENT_AMBULATORY_CARE_PROVIDER_SITE_OTHER): Payer: Managed Care, Other (non HMO) | Admitting: Unknown Physician Specialty

## 2015-12-09 VITALS — BP 128/89 | HR 99 | Temp 98.2°F | Ht 62.5 in | Wt 127.4 lb

## 2015-12-09 DIAGNOSIS — N898 Other specified noninflammatory disorders of vagina: Secondary | ICD-10-CM | POA: Diagnosis not present

## 2015-12-09 DIAGNOSIS — Z30011 Encounter for initial prescription of contraceptive pills: Secondary | ICD-10-CM

## 2015-12-09 LAB — WET PREP FOR TRICH, YEAST, CLUE
CLUE CELL EXAM: NEGATIVE
Trichomonas Exam: NEGATIVE
Yeast Exam: NEGATIVE

## 2015-12-09 MED ORDER — LEVONORGESTREL-ETHINYL ESTRAD 0.1-20 MG-MCG PO TABS: 1.0000 | ORAL_TABLET | Freq: Every day | ORAL | 3 refills | Status: DC

## 2015-12-09 NOTE — Assessment & Plan Note (Signed)
Will start BCPs for continuous use

## 2015-12-09 NOTE — Progress Notes (Signed)
   BP 128/89 (BP Location: Left Arm, Patient Position: Sitting, Cuff Size: Normal)   Pulse 99   Temp 98.2 F (36.8 C)   Ht 5' 2.5" (1.588 m)   Wt 127 lb 6.4 oz (57.8 kg)   LMP  (LMP Unknown)   SpO2 98%   BMI 22.93 kg/m    Subjective:    Patient ID: Janet Villanueva, female    DOB: 05-Jan-1996, 20 y.o.   MRN: [id number]  HPI: Janet Villanueva is a 20 y.o. female  Chief Complaint  Patient presents with  . Excessive Sweating    pt states she has been experiencing excessive sweating for a while now    States she is experiencing a "lot of excessive sweating" in her vaginal area.  No pain, itching, or burning.  She is using depo and not having regular menses. She did admit to missing her last depo.  [facility]  Relevant past medical, surgical, family and social history reviewed and updated as indicated. Interim medical history since our last visit reviewed. Allergies and medications reviewed and updated.  Review of Systems  All other systems reviewed and are negative.   Per HPI unless specifically indicated above     Objective:    BP 128/89 (BP Location: Left Arm, Patient Position: Sitting, Cuff Size: Normal)   Pulse 99   Temp 98.2 F (36.8 C)   Ht 5' 2.5" (1.588 m)   Wt 127 lb 6.4 oz (57.8 kg)   LMP  (LMP Unknown)   SpO2 98%   BMI 22.93 kg/m   Wt Readings from Last 3 Encounters:  12/09/15 127 lb 6.4 oz (57.8 kg)  09/30/15 120 lb (54.4 kg)  08/28/15 120 lb (54.4 kg)    Physical Exam  Constitutional: She is oriented to person, place, and time. She appears well-developed and well-nourished. No distress.  HENT:  Head: Normocephalic and atraumatic.  Eyes: Conjunctivae and lids are normal. Right eye exhibits no discharge. Left eye exhibits no discharge. No scleral icterus.  Neck: Normal range of motion. Neck supple. No JVD present. Carotid bruit is not present.  Cardiovascular: Normal rate, regular rhythm and normal heart sounds.     Pulmonary/Chest: Effort normal and breath sounds normal.  Abdominal: Normal appearance. There is no splenomegaly or hepatomegaly.  Genitourinary: Vagina normal.  Musculoskeletal: Normal range of motion.  Neurological: She is alert and oriented to person, place, and time.  Skin: Skin is warm, dry and intact. No rash noted. No pallor.  Psychiatric: She has a normal mood and affect. Her behavior is normal. Judgment and thought content normal.   Wet prep is negative  Results for orders placed or performed during the hospital encounter of 09/30/15  Rapid strep screen  Result Value Ref Range   Streptococcus, Group A Screen (Direct) POSITIVE (A) NEGATIVE  Mononucleosis screen  Result Value Ref Range   Mono Screen NEGATIVE NEGATIVE      Assessment & Plan:   Problem List Items Addressed This Visit      Unprioritized   Contraception management    Will start BCPs for continuous use       Other Visit Diagnoses    Vaginal discharge    -  Primary   Relevant Orders   Pap Lb, rfx HPV ASCU   WET PREP FOR TRICH, YEAST, CLUE        Follow up plan: Return in about 1 year (around 12/08/2016).

## 2015-12-13 LAB — PAP LB, RFX HPV ASCU: PAP SMEAR COMMENT: 0

## 2015-12-13 LAB — HPV DNA PROBE HIGH RISK, AMPLIFIED: HPV, high-risk: NEGATIVE

## 2015-12-16 ENCOUNTER — Encounter: Payer: Self-pay | Admitting: Unknown Physician Specialty

## 2016-02-12 ENCOUNTER — Ambulatory Visit
Admission: EM | Admit: 2016-02-12 | Discharge: 2016-02-12 | Disposition: A | Discharge status: Home / Self Care | Payer: Managed Care, Other (non HMO) | Attending: Family Medicine | Admitting: Family Medicine

## 2016-02-12 ENCOUNTER — Encounter: Payer: Self-pay | Admitting: *Deleted

## 2016-02-12 DIAGNOSIS — H6503 Acute serous otitis media, bilateral: Secondary | ICD-10-CM

## 2016-02-12 MED ORDER — AMOXICILLIN 875 MG PO TABS: 875.0000 mg | ORAL_TABLET | Freq: Two times a day (BID) | ORAL | 0 refills | Status: DC

## 2016-02-12 NOTE — ED Provider Notes (Signed)
[facility]    CSN: [id number] Arrival date & time: 02/12/16  1850     History   Chief Complaint Chief Complaint  Patient presents with  . Otalgia    HPI Janet Villanueva is a 20 y.o. female.   The history is provided by the patient.  Otalgia  Location:  Bilateral Behind ear:  No abnormality Quality:  Pressure Severity:  Mild Onset quality:  Sudden Duration:  2 days Timing:  Constant Progression:  Unchanged Chronicity:  New Relieved by:  None tried Associated symptoms: congestion and cough   Associated symptoms: no fever, no headaches and no tinnitus     Past Medical History:  Diagnosis Date  . Anemia     Patient Active Problem List   Diagnosis Date Noted  . IBS (irritable bowel syndrome) 05/20/2015  . Contraception management 02/28/2013  . Vaginal pain 08/27/2011    Past Surgical History:  Procedure Laterality Date  . TONSILLECTOMY    . TONSILLECTOMY      OB History    Gravida Para Term Preterm AB Living   0 0 0 0 0 0   SAB TAB Ectopic Multiple Live Births   0 0 0 0         Home Medications    Prior to Admission medications   Medication Sig Start Date End Date Taking? Authorizing Provider  levonorgestrel-ethinyl estradiol (AVIANE,ALESSE,LESSINA) 0.1-20 MG-MCG tablet Take 1 tablet by mouth daily. For continuous use. 4 packs in 3 months.  Skip placebo pills 12/09/15  Yes Gabriel Cirri, NP  amoxicillin (AMOXIL) 875 MG tablet Take 1 tablet (875 mg total) by mouth 2 (two) times daily. 02/12/16   Payton Mccallum, MD    Family History Family History  Problem Relation Age of Onset  . Cancer Maternal Grandmother     breast  . Diabetes Maternal Grandmother   . Hypertension Maternal Grandfather   . Thyroid disease Mother   . Asthma Sister   . Asthma Brother     Social History Social History  Substance Use Topics  . Smoking status: Never Smoker  . Smokeless tobacco: Never Used  . Alcohol use No     Allergies   Review of  patient's allergies indicates no known allergies.   Review of Systems Review of Systems  Constitutional: Negative for fever.  HENT: Positive for congestion and ear pain. Negative for tinnitus.   Respiratory: Positive for cough.   Neurological: Negative for headaches.     Physical Exam Triage Vital Signs ED Triage Vitals  Enc Vitals Group     BP 02/12/16 1912 (!) 147/98     Pulse Rate 02/12/16 1912 97     Resp 02/12/16 1912 16     Temp 02/12/16 1912 98.3 F (36.8 C)     Temp Source 02/12/16 1912 Oral     SpO2 02/12/16 1912 100 %     Weight 02/12/16 1913 130 lb (59 kg)     Height 02/12/16 1913 5\' 4"  (1.626 m)     Head Circumference --      Peak Flow --      Pain Score 02/12/16 1915 0     Pain Loc --      Pain Edu? --      Excl. in GC? --    No data found.   Updated Vital Signs BP (!) 147/98 (BP Location: Left Arm)   Pulse 97   Temp 98.3 F (36.8 C) (Oral)   Resp 16  Ht 5\' 4"  (1.626 m)   Wt 130 lb (59 kg)   LMP 02/09/2016   SpO2 100%   BMI 22.31 kg/m   Visual Acuity Right Eye Distance:   Left Eye Distance:   Bilateral Distance:    Right Eye Near:   Left Eye Near:    Bilateral Near:     Physical Exam  Constitutional: She appears well-developed and well-nourished. No distress.  HENT:  Head: Normocephalic and atraumatic.  Right Ear: External ear and ear canal normal. A middle ear effusion is present.  Left Ear: External ear and ear canal normal. A middle ear effusion is present.  Nose: No mucosal edema, rhinorrhea, nose lacerations, sinus tenderness, nasal deformity, septal deviation or nasal septal hematoma. No epistaxis.  No foreign bodies. Right sinus exhibits no maxillary sinus tenderness and no frontal sinus tenderness. Left sinus exhibits no maxillary sinus tenderness and no frontal sinus tenderness.  Mouth/Throat: Uvula is midline, oropharynx is clear and moist and mucous membranes are normal. No oropharyngeal exudate.  Eyes: Conjunctivae and EOM are  normal. Pupils are equal, round, and reactive to light. Right eye exhibits no discharge. Left eye exhibits no discharge. No scleral icterus.  Neck: Normal range of motion. Neck supple. No thyromegaly present.  Lymphadenopathy:    She has no cervical adenopathy.  Skin: She is not diaphoretic.  Nursing note and vitals reviewed.    UC Treatments / Results  Labs (all labs ordered are listed, but only abnormal results are displayed) Labs Reviewed - No data to display  EKG  EKG Interpretation None       Radiology No results found.  Procedures Procedures (including critical care time)  Medications Ordered in UC Medications - No data to display   Initial Impression / Assessment and Plan / UC Course  I have reviewed the triage vital signs and the nursing notes.  Pertinent labs & imaging results that were available during my care of the patient were reviewed by me and considered in my medical decision making (see chart for details).  Clinical Course      Final Clinical Impressions(s) / UC Diagnoses   Final diagnoses:  Bilateral acute serous otitis media, recurrence not specified    New Prescriptions Discharge Medication List as of 02/12/2016  7:26 PM    START taking these medications   Details  amoxicillin (AMOXIL) 875 MG tablet Take 1 tablet (875 mg total) by mouth 2 (two) times daily., Starting Wed 02/12/2016, Print       1. diagnosis reviewed with patient 2. rx as per orders above; reviewed possible side effects, interactions, risks and benefits  3. Recommend supportive treatment with otc analgesics prn 4. Follow-up prn if symptoms worsen or don't improve   Payton Mccallumrlando Brinleigh Tew, MD 02/12/16 [phone]

## 2016-02-12 NOTE — ED Triage Notes (Signed)
Patient reports having ear fullness starting 2 days ago. Patient sleeps with ear buds on and thinks they may have irritated her ears.

## 2016-02-17 ENCOUNTER — Ambulatory Visit: Payer: Managed Care, Other (non HMO) | Admitting: Unknown Physician Specialty

## 2016-03-02 ENCOUNTER — Ambulatory Visit: Payer: Managed Care, Other (non HMO) | Admitting: Unknown Physician Specialty

## 2016-04-02 ENCOUNTER — Ambulatory Visit: Payer: Managed Care, Other (non HMO) | Admitting: Unknown Physician Specialty

## 2016-04-09 ENCOUNTER — Ambulatory Visit: Payer: Managed Care, Other (non HMO) | Admitting: Family Medicine

## 2016-04-30 ENCOUNTER — Other Ambulatory Visit: Payer: Self-pay

## 2016-04-30 ENCOUNTER — Encounter: Payer: Self-pay | Admitting: Family Medicine

## 2016-04-30 ENCOUNTER — Ambulatory Visit (INDEPENDENT_AMBULATORY_CARE_PROVIDER_SITE_OTHER): Payer: Commercial Managed Care - PPO | Admitting: Family Medicine

## 2016-04-30 VITALS — BP 127/88 | HR 111 | Temp 99.2°F | Wt 132.0 lb

## 2016-04-30 DIAGNOSIS — J101 Influenza due to other identified influenza virus with other respiratory manifestations: Secondary | ICD-10-CM

## 2016-04-30 LAB — VERITOR FLU A/B WAIVED
Influenza A: POSITIVE — AB
Influenza B: NEGATIVE

## 2016-04-30 MED ORDER — BENZONATATE 100 MG PO CAPS: 200.0000 mg | ORAL_CAPSULE | Freq: Three times a day (TID) | ORAL | 0 refills | Status: DC | PRN

## 2016-04-30 MED ORDER — ONDANSETRON 4 MG PO TBDP: 4.0000 mg | ORAL_TABLET | Freq: Three times a day (TID) | ORAL | 0 refills | Status: DC | PRN

## 2016-04-30 MED ORDER — HYDROCOD POLST-CPM POLST ER 10-8 MG/5ML PO SUER: 5.0000 mL | Freq: Two times a day (BID) | ORAL | 0 refills | Status: DC | PRN

## 2016-04-30 NOTE — Patient Instructions (Signed)
Follow up as needed

## 2016-04-30 NOTE — Progress Notes (Signed)
BP 127/88   Pulse (!) 111   Temp 99.2 F (37.3 C)   Wt 132 lb (59.9 kg)   SpO2 99%   BMI 22.66 kg/m    Subjective:    Patient ID: Janet Villanueva, female    DOB: [birth year], 21 y.o.   MRN: [id number]  HPI: Janet GulaKiandra Lashae Marchi is a 21 y.o. female  Chief Complaint  Patient presents with  . URI    x 4 days, fever, body aches, cough, sore throat, chest congestion, vomiting. No head congestion. No headache.    Cough, body aches, fever, sore throat, vomiting x 4 days. Has not been able to keep anything down. Taking theraflu with no relief. No sick contacts that she is aware of.   Past Medical History:  Diagnosis Date  . Anemia    Social History   Social History  . Marital status: Single    Spouse name: N/A  . Number of children: N/A  . Years of education: N/A   Occupational History  . Not on file.   Social History Main Topics  . Smoking status: Never Smoker  . Smokeless tobacco: Never Used  . Alcohol use No  . Drug use: No  . Sexual activity: Yes    Partners: Male    Birth control/ protection: None   Other Topics Concern  . Not on file   Social History Narrative  . No narrative on file    Relevant past medical, surgical, family and social history reviewed and updated as indicated. Interim medical history since our last visit reviewed. Allergies and medications reviewed and updated.  Review of Systems  Constitutional: Positive for chills, diaphoresis, fatigue and fever.  HENT: Positive for congestion and sore throat.   Eyes: Negative.   Respiratory: Positive for cough.   Gastrointestinal: Negative.   Genitourinary: Negative.   Musculoskeletal: Positive for myalgias.  Skin: Negative.   Neurological: Negative.   Psychiatric/Behavioral: Negative.     Per HPI unless specifically indicated above     Objective:    BP 127/88   Pulse (!) 111   Temp 99.2 F (37.3 C)   Wt 132 lb (59.9 kg)   SpO2 99%   BMI 22.66 kg/m   Wt Readings from Last  3 Encounters:  04/30/16 132 lb (59.9 kg)  02/12/16 130 lb (59 kg)  12/09/15 127 lb 6.4 oz (57.8 kg)    Physical Exam  Constitutional: She is oriented to person, place, and time. She appears well-developed and well-nourished.  HENT:  Head: Atraumatic.  Eyes: Conjunctivae are normal. Pupils are equal, round, and reactive to light.  Neck: Normal range of motion. Neck supple.  Cardiovascular: Normal heart sounds.   tachycardic  Pulmonary/Chest: Effort normal.  Musculoskeletal: Normal range of motion.  Neurological: She is alert and oriented to person, place, and time.  Skin: Skin is warm and dry.  Psychiatric: She has a normal mood and affect. Her behavior is normal.  Nursing note and vitals reviewed.     Assessment & Plan:   Problem List Items Addressed This Visit    None    Visit Diagnoses    Influenza A    -  Primary   Not in tamiflu window. Will treat with tessalon, tussionex, and zofran for her nausea/vomiting. Discussed not returning to work or school for 7 days from onset   Relevant Orders   Influenza A & B (STAT)       Follow up plan: Return if symptoms worsen or  fail to improve.

## 2016-05-20 ENCOUNTER — Encounter: Payer: Managed Care, Other (non HMO) | Admitting: Unknown Physician Specialty

## 2016-05-29 ENCOUNTER — Ambulatory Visit: Payer: Commercial Managed Care - PPO | Admitting: Unknown Physician Specialty

## 2016-05-29 ENCOUNTER — Encounter: Payer: Self-pay | Admitting: Unknown Physician Specialty

## 2016-05-29 ENCOUNTER — Ambulatory Visit (INDEPENDENT_AMBULATORY_CARE_PROVIDER_SITE_OTHER): Payer: Commercial Managed Care - PPO | Admitting: Unknown Physician Specialty

## 2016-05-29 VITALS — BP 128/78 | HR 105 | Temp 98.4°F | Wt 130.6 lb

## 2016-05-29 DIAGNOSIS — Z309 Encounter for contraceptive management, unspecified: Secondary | ICD-10-CM | POA: Diagnosis not present

## 2016-05-29 LAB — PREGNANCY, URINE: PREG TEST UR: NEGATIVE

## 2016-05-29 MED ORDER — MEDROXYPROGESTERONE ACETATE 150 MG/ML IM SUSP
150.0000 mg | INTRAMUSCULAR | Status: AC
  Administered 2016-05-29 – 2016-08-28 (×2): 150 mg via INTRAMUSCULAR

## 2016-05-29 NOTE — Progress Notes (Signed)
   BP 128/78 (BP Location: Left Arm, Patient Position: Sitting, Cuff Size: Normal)   Pulse (!) 105   Temp 98.4 F (36.9 C)   Wt 130 lb 9.6 oz (59.2 kg)   LMP 04/22/2016 (Approximate)   SpO2 97%   BMI 22.42 kg/m    Subjective:    Patient ID: Janet Villanueva, female    DOB: Feb 03, 1996, 21 y.o.   MRN: [id number]  HPI: Janet Villanueva is a 21 y.o. female  Chief Complaint  Patient presents with  . Contraception    pt wants to start back on depo, last one was in May. Urine pregnancy ordered.    Pt states last menstrual period was the beginning of last month.  No issues with her previous depot shots.    Relevant past medical, surgical, family and social history reviewed and updated as indicated. Interim medical history since our last visit reviewed. Allergies and medications reviewed and updated.  Review of Systems  Per HPI unless specifically indicated above     Objective:    BP 128/78 (BP Location: Left Arm, Patient Position: Sitting, Cuff Size: Normal)   Pulse (!) 105   Temp 98.4 F (36.9 C)   Wt 130 lb 9.6 oz (59.2 kg)   LMP 04/22/2016 (Approximate)   SpO2 97%   BMI 22.42 kg/m   Wt Readings from Last 3 Encounters:  05/29/16 130 lb 9.6 oz (59.2 kg)  04/30/16 132 lb (59.9 kg)  02/12/16 130 lb (59 kg)    Physical Exam  Constitutional: She is oriented to person, place, and time. She appears well-developed and well-nourished. No distress.  HENT:  Head: Normocephalic and atraumatic.  Eyes: Conjunctivae and lids are normal. Right eye exhibits no discharge. Left eye exhibits no discharge. No scleral icterus.  Neck: Normal range of motion. Neck supple. No JVD present. Carotid bruit is not present.  Cardiovascular: Normal rate, regular rhythm and normal heart sounds.   Pulmonary/Chest: Effort normal and breath sounds normal.  Abdominal: Normal appearance. There is no splenomegaly or hepatomegaly.  Musculoskeletal: Normal range of motion.  Neurological: She is  alert and oriented to person, place, and time.  Skin: Skin is warm, dry and intact. No rash noted. No pallor.  Psychiatric: She has a normal mood and affect. Her behavior is normal. Judgment and thought content normal.    Results for orders placed or performed in visit on 04/30/16  Influenza A & B (STAT)  Result Value Ref Range   Influenza A Positive (A) Negative   Influenza B Negative Negative      Assessment & Plan:   Problem List Items Addressed This Visit      Unprioritized   Contraception management - Primary   Relevant Medications   medroxyPROGESTERone (DEPO-PROVERA) injection 150 mg (Start on 05/29/2016  4:45 PM)   Other Relevant Orders   Pregnancy, urine       Follow up plan: Return in about 6 months (around 11/26/2016).

## 2016-06-19 ENCOUNTER — Ambulatory Visit: Payer: Self-pay | Admitting: Family Medicine

## 2016-08-24 ENCOUNTER — Encounter: Payer: Self-pay | Admitting: Family Medicine

## 2016-08-24 ENCOUNTER — Ambulatory Visit (INDEPENDENT_AMBULATORY_CARE_PROVIDER_SITE_OTHER): Payer: Commercial Managed Care - PPO | Admitting: Family Medicine

## 2016-08-24 VITALS — BP 124/84 | HR 108 | Temp 98.6°F | Ht 62.5 in | Wt 131.0 lb

## 2016-08-24 DIAGNOSIS — Z021 Encounter for pre-employment examination: Secondary | ICD-10-CM | POA: Diagnosis not present

## 2016-08-24 DIAGNOSIS — N760 Acute vaginitis: Secondary | ICD-10-CM | POA: Diagnosis not present

## 2016-08-24 DIAGNOSIS — B9689 Other specified bacterial agents as the cause of diseases classified elsewhere: Secondary | ICD-10-CM

## 2016-08-24 MED ORDER — METRONIDAZOLE 500 MG PO TABS: 500.0000 mg | ORAL_TABLET | Freq: Two times a day (BID) | ORAL | 0 refills | Status: DC

## 2016-08-24 NOTE — Progress Notes (Signed)
   BP 124/84   Pulse (!) 108   Temp 98.6 F (37 C)   Ht 5' 2.5" (1.588 m)   Wt 131 lb (59.4 kg)   LMP 08/17/2016 (Exact Date)   SpO2 98%   BMI 23.58 kg/m    Subjective:    Patient ID: Janet Villanueva, female    DOB: August 16, 1995, 20 y.o.   MRN: [id number]  HPI: Janet Villanueva is a 21 y.o. female  Chief Complaint  Patient presents with  . Employment Form   Patient presents today with forms for her new job to be filled out. Appears to be UTD on everything except recent TB test and second MMR is not listed in her immunization records.   Also having vaginal discharge and odor for the past week or so. Has been struggling with intermittent bouts of BV improved with flagyl and wanting to see about getting treated again. Has been avoiding harsh soaps or douches. Denies urinary sxs, itching, rashes, or concern for STI.   Relevant past medical, surgical, family and social history reviewed and updated as indicated. Interim medical history since our last visit reviewed. Allergies and medications reviewed and updated.  Review of Systems  Constitutional: Negative.   HENT: Negative.   Respiratory: Negative.   Cardiovascular: Negative.   Gastrointestinal: Negative.   Genitourinary: Positive for vaginal discharge.       Vaginal odor  Musculoskeletal: Negative.   Neurological: Negative.   Psychiatric/Behavioral: Negative.     Per HPI unless specifically indicated above     Objective:    BP 124/84   Pulse (!) 108   Temp 98.6 F (37 C)   Ht 5' 2.5" (1.588 m)   Wt 131 lb (59.4 kg)   LMP 08/17/2016 (Exact Date)   SpO2 98%   BMI 23.58 kg/m   Wt Readings from Last 3 Encounters:  08/24/16 131 lb (59.4 kg)  05/29/16 130 lb 9.6 oz (59.2 kg)  04/30/16 132 lb (59.9 kg)    Physical Exam  Constitutional: She is oriented to person, place, and time. She appears well-developed and well-nourished. No distress.  HENT:  Head: Atraumatic.  Eyes: Conjunctivae are normal. Pupils  are equal, round, and reactive to light. No scleral icterus.  Neck: Normal range of motion. Neck supple.  Cardiovascular: Normal rate and normal heart sounds.   Pulmonary/Chest: Effort normal. No respiratory distress.  Musculoskeletal: Normal range of motion.  Neurological: She is alert and oriented to person, place, and time.  Skin: Skin is warm and dry.  Psychiatric: She has a normal mood and affect. Her behavior is normal.  Nursing note and vitals reviewed.     Assessment & Plan:   Problem List Items Addressed This Visit    None    Visit Diagnoses    Encounter for pre-employment examination    -  Primary   Will draw titers for MMR and quantiferon gold testing. Paperwork signed, await results.    Relevant Orders   Measles/Mumps/Rubella Immunity   Quantiferon tb gold assay (blood)   BV (bacterial vaginosis)       Will do another course of flagyl, if no improvement return for wet prep and exam. Reviewed pH balanced hygiene and probiotics to help with prevention    Relevant Medications   metroNIDAZOLE (FLAGYL) 500 MG tablet      Follow up plan: Return if symptoms worsen or fail to improve.

## 2016-08-24 NOTE — Patient Instructions (Signed)
Follow up if worsening or no improvement 

## 2016-08-25 LAB — MEASLES/MUMPS/RUBELLA IMMUNITY
MUMPS ABS, IGG: 71.8 [AU]/ml (ref >10.9)
RUBEOLA AB, IGG: <25 AU/mL — ABNORMAL LOW (ref >29.9)
Rubella Antibodies, IGG: 1.89 index (ref >0.99)

## 2016-08-26 LAB — QUANTIFERON IN TUBE
QFT TB AG MINUS NIL VALUE: 0.02 [IU]/mL
QUANTIFERON MITOGEN VALUE: 6.18 IU/mL
QUANTIFERON TB AG VALUE: 0.05 [IU]/mL
QUANTIFERON TB GOLD: NEGATIVE
Quantiferon Nil Value: 0.03 IU/mL

## 2016-08-26 LAB — QUANTIFERON TB GOLD ASSAY (BLOOD)

## 2016-08-27 ENCOUNTER — Telehealth: Payer: Self-pay | Admitting: Family Medicine

## 2016-08-27 NOTE — Telephone Encounter (Signed)
Please call pt and let her know that everything came back normal except she shows that she isn't immune to measles so show will need to come in for a booster

## 2016-08-27 NOTE — Telephone Encounter (Signed)
patient notified, she will come in for her MMR.

## 2016-08-28 ENCOUNTER — Ambulatory Visit (INDEPENDENT_AMBULATORY_CARE_PROVIDER_SITE_OTHER): Payer: Commercial Managed Care - PPO

## 2016-08-28 DIAGNOSIS — Z23 Encounter for immunization: Secondary | ICD-10-CM | POA: Diagnosis not present

## 2016-08-28 DIAGNOSIS — Z309 Encounter for contraceptive management, unspecified: Secondary | ICD-10-CM

## 2016-08-31 ENCOUNTER — Ambulatory Visit: Payer: Commercial Managed Care - PPO

## 2016-10-27 ENCOUNTER — Encounter: Payer: Self-pay | Admitting: Unknown Physician Specialty

## 2016-10-27 ENCOUNTER — Ambulatory Visit (INDEPENDENT_AMBULATORY_CARE_PROVIDER_SITE_OTHER): Payer: Commercial Managed Care - PPO | Admitting: Unknown Physician Specialty

## 2016-10-27 VITALS — BP 134/86 | HR 109 | Temp 98.6°F | Wt 125.8 lb

## 2016-10-27 DIAGNOSIS — M545 Low back pain, unspecified: Secondary | ICD-10-CM

## 2016-10-27 DIAGNOSIS — R002 Palpitations: Secondary | ICD-10-CM | POA: Insufficient documentation

## 2016-10-27 MED ORDER — CYCLOBENZAPRINE HCL 10 MG PO TABS: 10.0000 mg | ORAL_TABLET | Freq: Three times a day (TID) | ORAL | 0 refills | Status: DC | PRN

## 2016-10-27 MED ORDER — IBUPROFEN 800 MG PO TABS: 800.0000 mg | ORAL_TABLET | Freq: Three times a day (TID) | ORAL | 0 refills | Status: DC | PRN

## 2016-10-27 NOTE — Progress Notes (Signed)
BP 134/86   Pulse (!) 109   Temp 98.6 F (37 C)   Wt 125 lb 12.8 oz (57.1 kg)   LMP 09/22/2016 (Approximate)   SpO2 98%   BMI 22.64 kg/m    Subjective:    Patient ID: Janet Villanueva, female    DOB: [birth year], 21 y.o.   MRN: [id number]  HPI: Janet Villanueva is a 21 y.o. female  Chief Complaint  Patient presents with  . Back Pain    pt states that she thinks she pulled a muscle in her lower back at work on Thursday, states she thinks she made it worse by going to work again on Friday    Pt is here for complaints of pulling a muscle while she was at work.  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  She called her manager at the time.  States it got better but went back to work 2 days later and feels she aggravated it.  States she is still having pain while on feet and sitting upright.  Hurts later in the nights while working.  No bowel or bladder problems or leg numbness.  Using heating pad and muscle relaxers that were OTC  Heart palpitations Pt states she gets painful heart palpitations that comes and goes but states she cannot figure out what causes it.  States her heart does not race.  She her mother has a mitral valve prolapse as does her step-dad. He dad also has a heart problem.      Relevant past medical, surgical, family and social history reviewed and updated as indicated. Interim medical history since our last visit reviewed. Allergies and medications reviewed and updated.  Review of Systems  Per HPI unless specifically indicated above     Objective:    BP 134/86   Pulse (!) 109   Temp 98.6 F (37 C)   Wt 125 lb 12.8 oz (57.1 kg)   LMP 09/22/2016 (Approximate)   SpO2 98%   BMI 22.64 kg/m   Wt Readings from Last 3 Encounters:  10/27/16 125 lb 12.8 oz (57.1 kg)  08/24/16 131 lb (59.4 kg)  05/29/16 130 lb 9.6 oz (59.2 kg)    Physical Exam  Constitutional: She is oriented to person,  place, and time. She appears well-developed and well-nourished. No distress.  HENT:  Head: Normocephalic and atraumatic.  Eyes: Conjunctivae and lids are normal. Right eye exhibits no discharge. Left eye exhibits no discharge. No scleral icterus.  Cardiovascular: Normal rate.   Pulmonary/Chest: Effort normal.  Abdominal: Normal appearance. There is no splenomegaly or hepatomegaly.  Musculoskeletal:       Lumbar back: She exhibits decreased range of motion, tenderness and pain. She exhibits no bony tenderness, no swelling, no edema, no deformity and no laceration.  Tender right paravetebral  Neurological: She is alert and oriented to person, place, and time.  Skin: Skin is intact. No rash noted. No pallor.  Psychiatric: She has a normal mood and affect. Her behavior is normal. Judgment and thought content normal.     Assessment & Plan:   Problem List Items Addressed This Visit      Unprioritized   Heart palpitations    Will set up another appointment in about 4 weeks as she doesn't feel well enough to do an evaluation today       Other Visit Diagnoses    Acute midline low back pain  without sciatica    -  Primary   Will rx Ibuprofen and muscle relaxant.  I recommend workmen's comp for PT referral and light duty.  Pt will go to employee health   Relevant Medications   ibuprofen (ADVIL,MOTRIN) 800 MG tablet   cyclobenzaprine (FLEXERIL) 10 MG tablet       Follow up plan: Return in about 4 weeks (around 11/24/2016) for heart palpitations.

## 2016-10-27 NOTE — Assessment & Plan Note (Addendum)
Will set up another appointment in about 4 weeks as she doesn't feel well enough to do an evaluation today

## 2016-10-27 NOTE — Patient Instructions (Addendum)
Back Pain, Adult Back pain is very common in adults.The cause of back pain is rarely dangerous and the pain often gets better over time.The cause of your back pain may not be known. Some common causes of back pain include:  Strain of the muscles or ligaments supporting the spine.  Wear and tear (degeneration) of the spinal disks.  Arthritis.  Direct injury to the back.  For many people, back pain may return. Since back pain is rarely dangerous, most people can learn to manage this condition on their own. Follow these instructions at home: Watch your back pain for any changes. The following actions may help to lessen any discomfort you are feeling:  Remain active. It is stressful on your back to sit or stand in one place for long periods of time. Do not sit, drive, or stand in one place for more than 30 minutes at a time. Take short walks on even surfaces as soon as you are able.Try to increase the length of time you walk each day.  Exercise regularly as directed by your health care provider. Exercise helps your back heal faster. It also helps avoid future injury by keeping your muscles strong and flexible.  Do not stay in bed.Resting more than 1-2 days can delay your recovery.  Pay attention to your body when you bend and lift. The most comfortable positions are those that put less stress on your recovering back. Always use proper lifting techniques, including: ? Bending your knees. ? Keeping the load close to your body. ? Avoiding twisting.  Find a comfortable position to sleep. Use a firm mattress and lie on your side with your knees slightly bent. If you lie on your back, put a pillow under your knees.  Avoid feeling anxious or stressed.Stress increases muscle tension and can worsen back pain.It is important to recognize when you are anxious or stressed and learn ways to manage it, such as with exercise.  Take medicines only as directed by your health care provider.  Over-the-counter medicines to reduce pain and inflammation are often the most helpful.Your health care provider may prescribe muscle relaxant drugs.These medicines help dull your pain so you can more quickly return to your normal activities and healthy exercise.  Apply ice to the injured area: ? Put ice in a plastic bag. ? Place a towel between your skin and the bag. ? Leave the ice on for 20 minutes, 2-3 times a day for the first 2-3 days. After that, ice and heat may be alternated to reduce pain and spasms.  Maintain a healthy weight. Excess weight puts extra stress on your back and makes it difficult to maintain good posture.  Contact a health care provider if:  You have pain that is not relieved with rest or medicine.  You have increasing pain going down into the legs or buttocks.  You have pain that does not improve in one week.  You have night pain.  You lose weight.  You have a fever or chills. Get help right away if:  You develop new bowel or bladder control problems.  You have unusual weakness or numbness in your arms or legs.  You develop nausea or vomiting.  You develop abdominal pain.  You feel faint. This information is not intended to replace advice given to you by your health care provider. Make sure you discuss any questions you have with your health care provider. Document Released: 04/06/2005 Document Revised: 08/15/2015 Document Reviewed: 08/08/2013 Elsevier Interactive Patient Education    2017 [employer]  Back Exercises The following exercises strengthen the muscles that help to support the back. They also help to keep the lower back flexible. Doing these exercises can help to prevent back pain or lessen existing pain. If you have back pain or discomfort, try doing these exercises 2-3 times each day or as told by your health care provider. When the pain goes away, do them once each day, but increase the number of times that you repeat the steps for each  exercise (do more repetitions). If you do not have back pain or discomfort, do these exercises once each day or as told by your health care provider. Exercises Single Knee to Chest  Repeat these steps 3-5 times for each leg: 1. Lie on your back on a firm bed or the floor with your legs extended. 2. Bring one knee to your chest. Your other leg should stay extended and in contact with the floor. 3. Hold your knee in place by grabbing your knee or thigh. 4. Pull on your knee until you feel a gentle stretch in your lower back. 5. Hold the stretch for 10-30 seconds. 6. Slowly release and straighten your leg.  Pelvic Tilt  Repeat these steps 5-10 times: 1. Lie on your back on a firm bed or the floor with your legs extended. 2. Bend your knees so they are pointing toward the ceiling and your feet are flat on the floor. 3. Tighten your lower abdominal muscles to press your lower back against the floor. This motion will tilt your pelvis so your tailbone points up toward the ceiling instead of pointing to your feet or the floor. 4. With gentle tension and even breathing, hold this position for 5-10 seconds.  Cat-Cow  Repeat these steps until your lower back becomes more flexible: 1. Get into a hands-and-knees position on a firm surface. Keep your hands under your shoulders, and keep your knees under your hips. You may place padding under your knees for comfort. 2. Let your head hang down, and point your tailbone toward the floor so your lower back becomes rounded like the back of a cat. 3. Hold this position for 5 seconds. 4. Slowly lift your head and point your tailbone up toward the ceiling so your back forms a sagging arch like the back of a cow. 5. Hold this position for 5 seconds.  Press-Ups  Repeat these steps 5-10 times: 1. Lie on your abdomen (face-down) on the floor. 2. Place your palms near your head, about shoulder-width apart. 3. While you keep your back as relaxed as possible and  keep your hips on the floor, slowly straighten your arms to raise the top half of your body and lift your shoulders. Do not use your back muscles to raise your upper torso. You may adjust the placement of your hands to make yourself more comfortable. 4. Hold this position for 5 seconds while you keep your back relaxed. 5. Slowly return to lying flat on the floor.  Bridges  Repeat these steps 10 times: 1. Lie on your back on a firm surface. 2. Bend your knees so they are pointing toward the ceiling and your feet are flat on the floor. 3. Tighten your buttocks muscles and lift your buttocks off of the floor until your waist is at almost the same height as your knees. You should feel the muscles working in your buttocks and the back of your thighs. If you do not feel these muscles, slide your   feet 1-2 inches farther away from your buttocks. 4. Hold this position for 3-5 seconds. 5. Slowly lower your hips to the starting position, and allow your buttocks muscles to relax completely.  If this exercise is too easy, try doing it with your arms crossed over your chest. Abdominal Crunches  Repeat these steps 5-10 times: 1. Lie on your back on a firm bed or the floor with your legs extended. 2. Bend your knees so they are pointing toward the ceiling and your feet are flat on the floor. 3. Cross your arms over your chest. 4. Tip your chin slightly toward your chest without bending your neck. 5. Tighten your abdominal muscles and slowly raise your trunk (torso) high enough to lift your shoulder blades a tiny bit off of the floor. Avoid raising your torso higher than that, because it can put too much stress on your low back and it does not help to strengthen your abdominal muscles. 6. Slowly return to your starting position.  Back Lifts Repeat these steps 5-10 times: 1. Lie on your abdomen (face-down) with your arms at your sides, and rest your forehead on the floor. 2. Tighten the muscles in your legs  and your buttocks. 3. Slowly lift your chest off of the floor while you keep your hips pressed to the floor. Keep the back of your head in line with the curve in your back. Your eyes should be looking at the floor. 4. Hold this position for 3-5 seconds. 5. Slowly return to your starting position.  Contact a health care provider if:  Your back pain or discomfort gets much worse when you do an exercise.  Your back pain or discomfort does not lessen within 2 hours after you exercise. If you have any of these problems, stop doing these exercises right away. Do not do them again unless your health care provider says that you can. Get help right away if:  You develop sudden, severe back pain. If this happens, stop doing the exercises right away. Do not do them again unless your health care provider says that you can. This information is not intended to replace advice given to you by your health care provider. Make sure you discuss any questions you have with your health care provider. Document Released: 05/14/2004 Document Revised: 08/14/2015 Document Reviewed: 05/31/2014 Elsevier Interactive Patient Education  2017 [employer]  

## 2016-11-27 ENCOUNTER — Ambulatory Visit: Payer: Commercial Managed Care - PPO | Admitting: Unknown Physician Specialty

## 2016-12-09 ENCOUNTER — Encounter: Payer: Commercial Managed Care - PPO | Admitting: Unknown Physician Specialty

## 2016-12-09 ENCOUNTER — Encounter: Payer: Managed Care, Other (non HMO) | Admitting: Unknown Physician Specialty

## 2017-05-02 ENCOUNTER — Other Ambulatory Visit: Payer: Self-pay | Admitting: Family Medicine

## 2017-05-03 NOTE — Telephone Encounter (Signed)
Antibiotic refill?

## 2017-10-20 ENCOUNTER — Other Ambulatory Visit: Payer: Self-pay | Admitting: Family Medicine

## 2017-10-22 NOTE — Telephone Encounter (Signed)
LOV 10/27/16 Janet Villanueva Last refill 05/03/17

## 2017-10-22 NOTE — Telephone Encounter (Signed)
LVM for patient to schedule an appointment.

## 2017-12-06 ENCOUNTER — Telehealth: Payer: Self-pay | Admitting: Unknown Physician Specialty

## 2017-12-06 NOTE — Telephone Encounter (Unsigned)
Copied from CRM [phone]#[id number]. Topic: Quick Communication - See Telephone Encounter >> Dec 03, 2017  4:30 PM Waymon AmatoBurton, Donna F wrote: Pt is requesting that a copy of her immunizations be mailed to [address] [place] , [place] [id number]  [phone] best number for pt >> Dec 06, 2017 11:01 AM Gabriel Cirriurtis, Karen E wrote: Left VM for patient to call back with more details of where/why [place]

## 2018-04-29 ENCOUNTER — Ambulatory Visit: Payer: Commercial Managed Care - PPO | Admitting: Unknown Physician Specialty

## 2018-05-06 ENCOUNTER — Ambulatory Visit: Payer: Commercial Managed Care - PPO | Admitting: Unknown Physician Specialty

## 2018-05-27 ENCOUNTER — Ambulatory Visit: Payer: Commercial Managed Care - PPO | Admitting: Unknown Physician Specialty

## 2018-05-27 ENCOUNTER — Ambulatory Visit: Payer: Commercial Managed Care - PPO | Admitting: Family Medicine

## 2018-06-28 ENCOUNTER — Encounter: Payer: Self-pay | Admitting: Obstetrics and Gynecology

## 2018-06-28 ENCOUNTER — Ambulatory Visit (INDEPENDENT_AMBULATORY_CARE_PROVIDER_SITE_OTHER): Payer: Commercial Managed Care - PPO | Admitting: Obstetrics and Gynecology

## 2018-06-28 VITALS — BP 125/82 | HR 72 | Wt 164.5 lb

## 2018-06-28 DIAGNOSIS — B9689 Other specified bacterial agents as the cause of diseases classified elsewhere: Secondary | ICD-10-CM | POA: Diagnosis not present

## 2018-06-28 DIAGNOSIS — Z113 Encounter for screening for infections with a predominantly sexual mode of transmission: Secondary | ICD-10-CM | POA: Diagnosis not present

## 2018-06-28 DIAGNOSIS — N760 Acute vaginitis: Secondary | ICD-10-CM | POA: Diagnosis not present

## 2018-06-28 DIAGNOSIS — N898 Other specified noninflammatory disorders of vagina: Secondary | ICD-10-CM | POA: Diagnosis not present

## 2018-06-28 DIAGNOSIS — Z124 Encounter for screening for malignant neoplasm of cervix: Secondary | ICD-10-CM | POA: Diagnosis not present

## 2018-06-28 MED ORDER — FLUCONAZOLE 150 MG PO TABS: ORAL_TABLET | ORAL | 2 refills | Status: DC

## 2018-06-28 MED ORDER — METRONIDAZOLE 500 MG PO TABS: 500.0000 mg | ORAL_TABLET | Freq: Two times a day (BID) | ORAL | 2 refills | Status: AC

## 2018-06-28 NOTE — Progress Notes (Signed)
Obstetrics and Gynecology New Patient Evaluation  Appointment Date: 06/28/2018  OBGYN Clinic: Center for [facility]  Primary Care Provider: Gabriel Cirri  Referring Provider: Gabriel Cirri, NP  Chief Complaint:  Chief Complaint  Patient presents with  . Discuss BV    History of Present Illness: Janet Villanueva is a 23 y.o. African-American [id number] (Patient's last menstrual period was 05/31/2018.), seen for the above chief complaint. Her past medical history is significant for nothing.  Patient notes vaginal discharge and smell. She only notices after sex and it doesn't happen every time after sex and usually with a new partner or if there is internal ejaculation.  It sounds like she's only ever used oral flagyl with success. Gets s/s ?5x/year. Pt doesn't tub bathe and sometimes uses tampons but doesn't douche.   Currently, s/s have been going on for about 2-3 days, no VB, pain, itching or irritation and no dysuria.    Review of Systems:  as noted in the History of Present Illness.  Patient Active Problem List   Diagnosis Date Noted  . Heart palpitations 10/27/2016  . IBS (irritable bowel syndrome) 05/20/2015  . Contraception management 02/28/2013  . Vaginal pain 08/27/2011    Past Medical History:  Past Medical History:  Diagnosis Date  . Anemia     Past Surgical History:  Past Surgical History:  Procedure Laterality Date  . TONSILLECTOMY    . TONSILLECTOMY      Past Obstetrical History:  OB History  Gravida Para Term Preterm AB Living  0 0 0 0 0 0  SAB TAB Ectopic Multiple Live Births  0 0 0 0      Past Gynecological History: As per HPI. Periods: qmonth, regular, 1wk, not particularly heavy or painful History of Pap Smear(s): Yes.   Last pap 2017, which was ASCUS/HPV neg History of STI(s): No. She is currently using nothing for contraception.   Social History:  Social History   Socioeconomic History  . Marital status: Single   Spouse name: Not on file  . Number of children: Not on file  . Years of education: Not on file  . Highest education level: Not on file  Occupational History  . Not on file  Social Needs  . Financial resource strain: Not on file  . Food insecurity:    Worry: Not on file    Inability: Not on file  . Transportation needs:    Medical: Not on file    Non-medical: Not on file  Tobacco Use  . Smoking status: Current Every Day Smoker    Types: Cigars  . Smokeless tobacco: Never Used  Substance and Sexual Activity  . Alcohol use: No  . Drug use: No  . Sexual activity: Yes    Partners: Male    Birth control/protection: None  Lifestyle  . Physical activity:    Days per week: Not on file    Minutes per session: Not on file  . Stress: Not on file  Relationships  . Social connections:    Talks on phone: Not on file    Gets together: Not on file    Attends religious service: Not on file    Active member of club or organization: Not on file    Attends meetings of clubs or organizations: Not on file    Relationship status: Not on file  . Intimate partner violence:    Fear of current or ex partner: Not on file    Emotionally abused: Not on file  Physically abused: Not on file    Forced sexual activity: Not on file  Other Topics Concern  . Not on file  Social History Narrative  . Not on file    Family History:  Family History  Problem Relation Age of Onset  . Cancer Maternal Grandmother        breast  . Diabetes Maternal Grandmother   . Hypertension Maternal Grandfather   . Thyroid disease Mother   . Asthma Sister   . Asthma Brother    Medications [place] L. Rexach had no medications administered during this visit. Current Outpatient Medications  Medication Sig Dispense Refill  . cyclobenzaprine (FLEXERIL) 10 MG tablet Take 1 tablet (10 mg total) by mouth 3 (three) times daily as needed for muscle spasms. (Patient not taking: Reported on 06/28/2018) 30 tablet 0  .  Fluocinonide 0.1 % CREA APPLY TO AFFECTED AREA TWICE A DAY  2  . ibuprofen (ADVIL,MOTRIN) 800 MG tablet Take 1 tablet (800 mg total) by mouth every 8 (eight) hours as needed. (Patient not taking: Reported on 06/28/2018) 30 tablet 0   No current facility-administered medications for this visit.     Allergies Patient has no known allergies.   Physical Exam:  BP 125/82   Pulse 72   Wt 164 lb 8 oz (74.6 kg)   LMP 05/31/2018   BMI 29.61 kg/m  Body mass index is 29.61 kg/m. General appearance: Well nourished, well developed female in no acute distress.  Respiratory:  Normal respiratory effort Abdomen: positive bowel sounds and no masses, hernias; diffusely non tender to palpation, non distended Neuro/Psych:  Normal mood and affect.  Skin:  Warm and dry.  Lymphatic:  No inguinal lymphadenopathy.   Pelvic exam: is not limited by body habitus EGBUS: within normal limits, Vagina: +white, malodorous d/c in vault, ?slight cottage cheese like d/c, no blood. Cervix: normal appearing cervix without tenderness, discharge or lesions. Uterus:  nonenlarged and non tender and Adnexa:  normal adnexa and no mass, fullness, tenderness Rectovaginal: deferred  Laboratory: none  Radiology: none  Assessment: BV, likely yeast  Plan:  1. Vaginal discharge I told her I recommend Kefir quarter cup qhs to help with flora and as a probiotic. I sent in flagyl and diflucan and a few refills. I told her if she uses more than what is given to let us know as may need tx for recurrent infection with stronger medication. Pt amenable to plan.   D/w pt and she declines anything for birth control; brochure given.   Pap updated. Pt amenable to serum STI testing  RTC PRN  Cornelia Copa MD Attending Center for [facility])

## 2018-06-28 NOTE — Progress Notes (Signed)
LAST PAP 2017

## 2018-06-28 NOTE — Patient Instructions (Signed)
I recommend drinking Kefir quarter cup every night before bed.

## 2018-06-29 LAB — CYTOLOGY - PAP
CHLAMYDIA, DNA PROBE: NEGATIVE
Diagnosis: NEGATIVE
NEISSERIA GONORRHEA: NEGATIVE
TRICH (WINDOWPATH): NEGATIVE

## 2018-06-29 LAB — RPR: RPR Ser Ql: NONREACTIVE

## 2018-06-29 LAB — HEPATITIS B SURFACE ANTIGEN: Hepatitis B Surface Ag: NEGATIVE

## 2018-06-29 LAB — HEPATITIS C ANTIBODY: Hep C Virus Ab: <0.1 s/co ratio (ref 0.0–0.9)

## 2018-06-29 LAB — HIV ANTIBODY (ROUTINE TESTING W REFLEX): HIV SCREEN 4TH GENERATION: NONREACTIVE

## 2018-06-30 LAB — NUSWAB BV AND CANDIDA, NAA
Atopobium vaginae: HIGH Score — AB
BVAB 2: HIGH Score — AB
CANDIDA ALBICANS, NAA: NEGATIVE
CANDIDA GLABRATA, NAA: NEGATIVE
MEGASPHAERA 1: HIGH {score} — AB

## 2018-07-22 ENCOUNTER — Other Ambulatory Visit: Payer: Self-pay | Admitting: *Deleted

## 2018-07-22 MED ORDER — METRONIDAZOLE 500 MG PO TABS: 500.0000 mg | ORAL_TABLET | Freq: Two times a day (BID) | ORAL | 0 refills | Status: DC

## 2018-11-02 ENCOUNTER — Other Ambulatory Visit: Payer: Self-pay

## 2018-11-03 ENCOUNTER — Other Ambulatory Visit: Payer: Self-pay

## 2018-11-03 MED ORDER — METRONIDAZOLE 500 MG PO TABS: 500.0000 mg | ORAL_TABLET | Freq: Two times a day (BID) | ORAL | 0 refills | Status: DC

## 2019-01-03 ENCOUNTER — Other Ambulatory Visit: Payer: Self-pay

## 2019-01-04 MED ORDER — METRONIDAZOLE 500 MG PO TABS: 500.0000 mg | ORAL_TABLET | Freq: Two times a day (BID) | ORAL | 0 refills | Status: DC

## 2019-03-13 ENCOUNTER — Other Ambulatory Visit: Payer: Self-pay

## 2019-03-13 ENCOUNTER — Ambulatory Visit (INDEPENDENT_AMBULATORY_CARE_PROVIDER_SITE_OTHER): Payer: Commercial Managed Care - PPO

## 2019-03-13 ENCOUNTER — Ambulatory Visit
Admission: EM | Admit: 2019-03-13 | Discharge: 2019-03-13 | Disposition: A | Discharge status: Home / Self Care | Payer: Commercial Managed Care - PPO | Attending: Family Medicine | Admitting: Family Medicine

## 2019-03-13 DIAGNOSIS — W108XXA Fall (on) (from) other stairs and steps, initial encounter: Secondary | ICD-10-CM | POA: Diagnosis not present

## 2019-03-13 DIAGNOSIS — S7012XA Contusion of left thigh, initial encounter: Secondary | ICD-10-CM

## 2019-03-13 MED ORDER — MELOXICAM 15 MG PO TABS: 15.0000 mg | ORAL_TABLET | Freq: Every day | ORAL | 0 refills | Status: DC | PRN

## 2019-03-13 NOTE — ED Triage Notes (Signed)
Pt state she fell about 10 days ago. Injured her left posterior thigh. Pt stayed out of work for a week and then returned on Saturday but leg is still bothering her.

## 2019-03-13 NOTE — Discharge Instructions (Signed)
Take medication as prescribed. Rest. Drink plenty of fluids. Apply warm compresses. Monitor.   Follow up with your primary care physician or orthopedic this week as needed. Return to Urgent care for new or worsening concerns.

## 2019-03-13 NOTE — ED Provider Notes (Signed)
[facility] ____________________________________________  Time seen: Approximately 4:30 PM  I have reviewed the triage vital signs and the nursing notes.   HISTORY  Chief Complaint Leg Pain   HPI Janet Villanueva is a 23 y.o. female presenting for evaluation of left thigh pain.  Patient reports just over 1 week ago she slipped while at the club and fell onto her left thigh on a step.  States the edge of the step hit directly into the side of her thigh.  Reports pain since.  States within a day she had a very large bruise that was tight and painful.  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  reports the area has remained tender prompted her to come in.  Denies pain radiation, paresthesias, hip injury, knee injury, lower extremity pain or swelling.  States bruising to lateral thigh has continued.  Not on any oral contraceptive or hormonal medication.  Denies pregnancy.  No recent immobilization, cough, fevers or sickness.  Has been applying ice and taken over-the-counter Tylenol ibuprofen that helps with no resolution.  Patient's last menstrual period was 03/05/2019. :  Gabriel Cirri, NP: PCP   Past Medical History:  Diagnosis Date  . Anemia     Patient Active Problem List   Diagnosis Date Noted  . BV (bacterial vaginosis) 06/28/2018  . Heart palpitations 10/27/2016  . IBS (irritable bowel syndrome) 05/20/2015  . Contraception management 02/28/2013  . Vaginal pain 08/27/2011    Past Surgical History:  Procedure Laterality Date  . TONSILLECTOMY    . TONSILLECTOMY       No current facility-administered medications for this encounter.   Current Outpatient Medications:  .  cyclobenzaprine (FLEXERIL) 10 MG tablet, Take 1 tablet (10 mg total) by mouth 3 (three) times daily as needed for muscle spasms. (Patient not taking: Reported on 06/28/2018), Disp: 30 tablet,  Rfl: 0 .  fluconazole (DIFLUCAN) 150 MG tablet, One tab po now and repeat in 3 days, Disp: 2 tablet, Rfl: 2 .  Fluocinonide 0.1 % CREA, APPLY TO AFFECTED AREA TWICE A DAY, Disp: , Rfl: 2 .  ibuprofen (ADVIL,MOTRIN) 800 MG tablet, Take 1 tablet (800 mg total) by mouth every 8 (eight) hours as needed. (Patient not taking: Reported on 06/28/2018), Disp: 30 tablet, Rfl: 0 .  meloxicam (MOBIC) 15 MG tablet, Take 1 tablet (15 mg total) by mouth daily as needed., Disp: 10 tablet, Rfl: 0 .  metroNIDAZOLE (FLAGYL) 500 MG tablet, Take 1 tablet (500 mg total) by mouth 2 (two) times daily., Disp: 14 tablet, Rfl: 0 .  metroNIDAZOLE (FLAGYL) 500 MG tablet, Take 1 tablet (500 mg total) by mouth 2 (two) times daily., Disp: 14 tablet, Rfl: 0  Allergies Patient has no known allergies.  Family History  Problem Relation Age of Onset  . Cancer Maternal Grandmother        breast  . Diabetes Maternal Grandmother   . Hypertension Maternal Grandfather   . Thyroid disease Mother   . Asthma Sister   . Asthma Brother     Social History Social History   Tobacco Use  . Smoking status: Current Every Day Smoker    Types: Cigars  . Smokeless tobacco: Never Used  Substance Use Topics  . Alcohol use: Yes    Comment: social  . Drug use: No    Review of Systems Constitutional: No fever ENT:  No sore throat. Cardiovascular: Denies chest pain. Respiratory: Denies shortness of breath. Gastrointestinal: No abdominal pain.   Musculoskeletal: Positive left leg pain. Skin: Negative for rash. Neurological: Negative for headaches, focal weakness or numbness.   ____________________________________________   PHYSICAL EXAM:  VITAL SIGNS: ED Triage Vitals  Enc Vitals Group     BP 03/13/19 1449 (!) 144/104     Pulse Rate 03/13/19 1449 98     Resp 03/13/19 1449 16     Temp 03/13/19 1449 98.7 F (37.1 C)     Temp Source 03/13/19 1449 Oral     SpO2 03/13/19 1449 100 %     Weight 03/13/19 1447 136 lb 11 oz (62  kg)     Height 03/13/19 1447 5\' 2"  (1.575 m)     Head Circumference --      Peak Flow --      Pain Score 03/13/19 1446 4     Pain Loc --      Pain Edu? --      Excl. in [place]? --     Constitutional: Alert and oriented. Well appearing and in no acute distress. Eyes: Conjunctivae are normal.  ENT      Head: Normocephalic and atraumatic. Cardiovascular: Normal rate, regular rhythm. Grossly normal heart sounds.  Good peripheral circulation. Respiratory: Normal respiratory effort without tachypnea nor retractions. Breath sounds are clear and equal bilaterally. No wheezes, rales, rhonchi. Musculoskeletal: No midline cervical, thoracic or lumbar tenderness to palpation. Bilateral pedal pulses equal and easily palpated.  Steady gait.  Changes positions quickly. Except: Left lateral mid thigh approximately 5 inch area of ecchymosis with direct tenderness, mild localized edema at ecchymosis size, edema is noncircumferential, no erythema, skin intact, no point bony tenderness, quadriceps nontender, no point tenderness to hamstring, able to squat and stand without difficulty, knee nontender, no distal extremity edema. Neurologic:  Normal speech and language. Speech is normal. No gait instability.  Skin:  Skin is warm, dry and intact. No rash noted. Psychiatric: Mood and affect are normal. Speech and behavior are normal. Patient exhibits appropriate insight and judgment   ___________________________________________   LABS (all labs ordered are listed, but only abnormal results are displayed)  Labs Reviewed - No data to display ____________________________________________________________________________________  RADIOLOGY  Dg Femur Min 2 Views Left  Result Date: 03/13/2019 CLINICAL DATA:  23 year old female with fall and left lower extremity pain. EXAM: LEFT FEMUR 2 VIEWS COMPARISON:  None. FINDINGS: There is no evidence of fracture or other focal bone lesions. Soft tissues are unremarkable.  IMPRESSION: Negative. Electronically Signed   By: Elgie CollardArash  Radparvar M.D.   On: 03/13/2019 15:52   ____________________________________________   PROCEDURES Procedures    INITIAL IMPRESSION / ASSESSMENT AND PLAN / ED COURSE  Pertinent labs & imaging results that were available during my care of the patient were reviewed by me and considered in my medical decision making (see chart for details).  Well-appearing patient.  No acute distress.  Discussed multiple differentials with patient including contusion/hematoma, fracture, DVT, cellulitis.  Suspect contusion hematoma.  No distal edema pulses intact, low risk factor for DVT, felt less likely.  Left femur x-ray as above negative.  Suspect hematoma contusion.  Encourage heat, warm compresses, stretching will start on Mobic.  Discussed very strict follow-up and return parameters.  Work note given.Discussed indication, risks and benefits of medications with patient.   Discussed follow up with Primary care physician this week. Discussed follow up and return parameters including no resolution or any worsening  concerns. Patient verbalized understanding and agreed to plan.   ____________________________________________   FINAL CLINICAL IMPRESSION(S) / ED DIAGNOSES  Final diagnoses:  Contusion of left thigh, initial encounter     ED Discharge Orders         Ordered    meloxicam (MOBIC) 15 MG tablet  Daily PRN     03/13/19 1555           Note: This dictation was prepared with Dragon dictation along with smaller phrase technology. Any transcriptional errors that result from this process are unintentional.         Marylene Land, NP 03/13/19 [phone]

## 2019-03-28 ENCOUNTER — Ambulatory Visit: Payer: Commercial Managed Care - PPO | Admitting: Nurse Practitioner

## 2019-04-07 ENCOUNTER — Other Ambulatory Visit: Payer: Self-pay

## 2019-04-08 ENCOUNTER — Encounter: Payer: Self-pay | Admitting: Unknown Physician Specialty

## 2019-04-10 ENCOUNTER — Ambulatory Visit (INDEPENDENT_AMBULATORY_CARE_PROVIDER_SITE_OTHER): Payer: Commercial Managed Care - PPO

## 2019-04-10 ENCOUNTER — Other Ambulatory Visit: Payer: Self-pay

## 2019-04-10 VITALS — BP 143/110 | HR 78

## 2019-04-10 DIAGNOSIS — Z113 Encounter for screening for infections with a predominantly sexual mode of transmission: Secondary | ICD-10-CM

## 2019-04-10 DIAGNOSIS — B373 Candidiasis of vulva and vagina: Secondary | ICD-10-CM

## 2019-04-10 DIAGNOSIS — N898 Other specified noninflammatory disorders of vagina: Secondary | ICD-10-CM | POA: Diagnosis not present

## 2019-04-10 DIAGNOSIS — N760 Acute vaginitis: Secondary | ICD-10-CM | POA: Diagnosis not present

## 2019-04-10 DIAGNOSIS — B9689 Other specified bacterial agents as the cause of diseases classified elsewhere: Secondary | ICD-10-CM

## 2019-04-10 MED ORDER — METRONIDAZOLE 500 MG PO TABS: 500.0000 mg | ORAL_TABLET | Freq: Two times a day (BID) | ORAL | 0 refills | Status: DC

## 2019-04-10 NOTE — Progress Notes (Signed)
SUBJECTIVE:  23 y.o. female complains of  vag discharge for a couple of days. Denies abnormal vaginal bleeding or significant pelvic pain or fever. No UTI symptoms. Denies history of known exposure to STD. Patient blood pressure is elevated she plans to follow up with her pcp asap.   No LMP recorded.  OBJECTIVE:  She appears well, afebrile. Urine dipstick: not done   ASSESSMENT:  Vaginal Discharge: small amount  Vaginal Odor: none  PLAN:  GC, chlamydia, trichomonas, BVAG, CVAG probe sent to lab. Treatment: To be determined once lab results are received ROV prn if symptoms persist or worsen.

## 2019-04-12 LAB — CERVICOVAGINAL ANCILLARY ONLY
Bacterial Vaginitis (gardnerella): POSITIVE — AB
Candida Glabrata: NEGATIVE
Candida Vaginitis: POSITIVE — AB
Chlamydia: NEGATIVE
Comment: NEGATIVE
Comment: NEGATIVE
Comment: NEGATIVE
Comment: NEGATIVE
Comment: NEGATIVE
Comment: NORMAL
Neisseria Gonorrhea: NEGATIVE
Trichomonas: NEGATIVE

## 2019-04-17 ENCOUNTER — Other Ambulatory Visit: Payer: Self-pay | Admitting: Obstetrics and Gynecology

## 2019-04-17 MED ORDER — FLUCONAZOLE 150 MG PO TABS: ORAL_TABLET | ORAL | 1 refills | Status: DC

## 2019-04-24 ENCOUNTER — Ambulatory Visit: Payer: Commercial Managed Care - PPO

## 2019-04-25 ENCOUNTER — Other Ambulatory Visit: Payer: Self-pay

## 2019-04-25 ENCOUNTER — Encounter: Payer: Self-pay | Admitting: Emergency Medicine

## 2019-04-25 ENCOUNTER — Ambulatory Visit
Admission: EM | Admit: 2019-04-25 | Discharge: 2019-04-25 | Disposition: A | Discharge status: Home / Self Care | Payer: Commercial Managed Care - PPO | Attending: Emergency Medicine | Admitting: Emergency Medicine

## 2019-04-25 ENCOUNTER — Ambulatory Visit: Payer: Self-pay | Admitting: *Deleted

## 2019-04-25 DIAGNOSIS — F17210 Nicotine dependence, cigarettes, uncomplicated: Secondary | ICD-10-CM

## 2019-04-25 DIAGNOSIS — R030 Elevated blood-pressure reading, without diagnosis of hypertension: Secondary | ICD-10-CM

## 2019-04-25 DIAGNOSIS — R42 Dizziness and giddiness: Secondary | ICD-10-CM

## 2019-04-25 NOTE — Telephone Encounter (Signed)
Agree with this plan and please try to get in for follow-up in next few weeks as has not had visit in office in while and have not met her as of yet.

## 2019-04-25 NOTE — Discharge Instructions (Addendum)
Your blood pressure is elevated today at 142/102.  Please have this rechecked by your primary care provider as scheduled on 04/27/2019.     Change positions slowly.  Keep yourself hydrated with 8-10 glasses of water each day.     Your COVID test is pending.  You should self quarantine until your test result is back and is negative.  Go to the emergency department if you develop high fever, shortness of breath, severe diarrhea, or other concerning symptoms.

## 2019-04-25 NOTE — ED Provider Notes (Signed)
Janet Villanueva    CSN: [id number] Arrival date & time: 04/25/19  1211      History   Chief Complaint Chief Complaint  Patient presents with  . Hypertension  . Dizziness    HPI Janet Villanueva is a 24 y.o. female.   Patient presents with dizziness, fatigue, and mild headache since this morning.  She took her blood pressure at that time and reports it was 149/118; she reports she has been having elevated blood pressures for 2 months.  She called her PCP and was instructed to come here.  She has an appointment with her PCP on 04/27/2019 to discuss her blood pressure.  Patient denies focal weakness, chest pain, palpitations, shortness of breath, nausea, diaphoresis, fever, or other symptoms.  The history is provided by the patient.    Past Medical History:  Diagnosis Date  . Anemia     Patient Active Problem List   Diagnosis Date Noted  . BV (bacterial vaginosis) 06/28/2018  . Heart palpitations 10/27/2016  . IBS (irritable bowel syndrome) 05/20/2015  . Contraception management 02/28/2013  . Vaginal pain 08/27/2011    Past Surgical History:  Procedure Laterality Date  . TONSILLECTOMY    . TONSILLECTOMY      OB History    Gravida  0   Para  0   Term  0   Preterm  0   AB  0   Living  0     SAB  0   TAB  0   Ectopic  0   Multiple  0   Live Births               Home Medications    Prior to Admission medications   Medication Sig Start Date End Date Taking? Authorizing Provider  cyclobenzaprine (FLEXERIL) 10 MG tablet Take 1 tablet (10 mg total) by mouth 3 (three) times daily as needed for muscle spasms. Patient not taking: Reported on 06/28/2018 10/27/16   Gabriel Cirri, NP  fluconazole (DIFLUCAN) 150 MG tablet One tab po now and repeat in 3 days 04/17/19   [place] Bing, MD  Fluocinonide 0.1 % CREA APPLY TO AFFECTED AREA TWICE A DAY 07/02/16   [provider]  ibuprofen (ADVIL,MOTRIN) 800 MG tablet Take 1 tablet (800 mg  total) by mouth every 8 (eight) hours as needed. Patient not taking: Reported on 06/28/2018 10/27/16   Gabriel Cirri, NP  meloxicam (MOBIC) 15 MG tablet Take 1 tablet (15 mg total) by mouth daily as needed. Patient not taking: Reported on 04/10/2019 03/13/19   Renford Dills, NP  metroNIDAZOLE (FLAGYL) 500 MG tablet Take 1 tablet (500 mg total) by mouth 2 (two) times daily. Patient not taking: Reported on 04/10/2019 07/22/18   Anyanwu, Jethro Bastos, MD  metroNIDAZOLE (FLAGYL) 500 MG tablet Take 1 tablet (500 mg total) by mouth 2 (two) times daily. Patient not taking: Reported on 04/10/2019 04/10/19   [place] Bing, MD    Family History Family History  Problem Relation Age of Onset  . Cancer Maternal Grandmother        breast  . Diabetes Maternal Grandmother   . Hypertension Maternal Grandfather   . Thyroid disease Mother   . Asthma Sister   . Asthma Brother     Social History Social History   Tobacco Use  . Smoking status: Current Every Day Smoker    Types: Cigars  . Smokeless tobacco: Never Used  Substance Use Topics  . Alcohol use: Yes  Comment: social  . Drug use: No     Allergies   Patient has no known allergies.   Review of Systems Review of Systems  Constitutional: Positive for fatigue. Negative for chills and fever.  HENT: Negative for congestion, ear pain, rhinorrhea and sore throat.   Eyes: Negative for pain and visual disturbance.  Respiratory: Negative for cough and shortness of breath.   Cardiovascular: Negative for chest pain and palpitations.  Gastrointestinal: Negative for abdominal pain, diarrhea, nausea and vomiting.  Genitourinary: Negative for dysuria and hematuria.  Musculoskeletal: Negative for arthralgias and back pain.  Skin: Negative for color change and rash.  Neurological: Positive for dizziness and headaches. Negative for seizures, syncope, facial asymmetry, speech difficulty, weakness and numbness.  All other systems reviewed and are  negative.    Physical Exam Triage Vital Signs ED Triage Vitals  Enc Vitals Group     BP      Pulse      Resp      Temp      Temp src      SpO2      Weight      Height      Head Circumference      Peak Flow      Pain Score      Pain Loc      Pain Edu?      Excl. in [place]?    No data found.  Updated Vital Signs BP (!) 142/102 (BP Location: Left Arm)   Pulse 85   Temp 98.7 F (37.1 C) (Oral)   Resp 16   Wt 136 lb (61.7 kg)   LMP 03/29/2019   SpO2 98%   BMI 24.87 kg/m   Visual Acuity Right Eye Distance:   Left Eye Distance:   Bilateral Distance:    Right Eye Near:   Left Eye Near:    Bilateral Near:     Physical Exam Vitals and nursing note reviewed.  Constitutional:      General: She is not in acute distress.    Appearance: She is well-developed.  HENT:     Head: Normocephalic and atraumatic.     Right Ear: Tympanic membrane normal.     Left Ear: Tympanic membrane normal.     Nose: Nose normal.     Mouth/Throat:     Mouth: Mucous membranes are moist.     Pharynx: Oropharynx is clear.  Eyes:     Conjunctiva/sclera: Conjunctivae normal.  Cardiovascular:     Rate and Rhythm: Normal rate and regular rhythm.     Heart sounds: Normal heart sounds. No murmur.  Pulmonary:     Effort: Pulmonary effort is normal. No respiratory distress.     Breath sounds: Normal breath sounds.  Abdominal:     General: Bowel sounds are normal.     Palpations: Abdomen is soft.     Tenderness: There is no abdominal tenderness. There is no guarding or rebound.  Musculoskeletal:     Cervical back: Neck supple.     Right lower leg: No edema.     Left lower leg: No edema.  Skin:    General: Skin is warm and dry.     Findings: No rash.  Neurological:     General: No focal deficit present.     Mental Status: She is alert and oriented to person, place, and time.     Sensory: No sensory deficit.     Motor: No weakness.     Gait:  Gait normal.  Psychiatric:        Mood and  Affect: Mood normal.        Behavior: Behavior normal.      UC Treatments / Results  Labs (all labs ordered are listed, but only abnormal results are displayed) Labs Reviewed  NOVEL CORONAVIRUS, NAA    EKG   Radiology No results found.  Procedures Procedures (including critical care time)  Medications Ordered in UC Medications - No data to display  Initial Impression / Assessment and Plan / UC Course  I have reviewed the triage vital signs and the nursing notes.  Pertinent labs & imaging results that were available during my care of the patient were reviewed by me and considered in my medical decision making (see chart for details).    Dizziness, elevated blood pressure.  Instructed patient to keep a log of her blood pressures twice a day until she sees her PCP on 04/27/2019.  Instructed patient to change positions slowly and to keep herself hydrated with 8 to 10 glasses of water each day.  Instructed patient to go to the emergency department if she has acute worsening symptoms or develops new symptoms such as chest pain, palpitations, weakness, or other concerns.  COVID test performed here.  Instructed patient to self quarantine until the test result is back.  Instructed patient to go to the emergency department if she develops high fever, shortness of breath, severe diarrhea, or other concerning symptoms.  Patient agrees with plan of care.     Final Clinical Impressions(s) / UC Diagnoses   Final diagnoses:  Dizziness and giddiness  Elevated blood pressure reading     Discharge Instructions     Your blood pressure is elevated today at 142/102.  Please have this rechecked by your primary care provider as scheduled on 04/27/2019.     Change positions slowly.  Keep yourself hydrated with 8-10 glasses of water each day.     Your COVID test is pending.  You should self quarantine until your test result is back and is negative.  Go to the emergency department if you develop  high fever, shortness of breath, severe diarrhea, or other concerning symptoms.        ED Prescriptions    None     PDMP not reviewed this encounter.   Mickie Bail, NP 04/25/19 1247

## 2019-04-25 NOTE — Telephone Encounter (Signed)
Pt called with having an elevated b/p of 149/118 and dizzy.  She rechecked her bp twice and the readings are 147/111 Hr 84 and 143/108 Hr 83. She denies nausea, weakness, shortness of breath, blurred vision or chest pain. There is a family hx of hypertension.  She has a dull headache and is dizzy. She has an appointment scheduled for Thursday. Advised to see a provider per protocol within 24 hours, because of the diastolic going up to 118. She is also advised to go to the ED if increase in symptoms with an elevated b/p. She voiced understanding and will have her mom drive her to an UC.  Routing to [facility] for review.  dReason for Disposition . Systolic BP  >= 180 OR Diastolic >= 110  Answer Assessment - Initial Assessment Questions 1. BLOOD PRESSURE: "What is the blood pressure?" "Did you take at least two measurements 5 minutes apart?"     147/11 HR 84 and 143/108 HR 83 2. ONSET: "When did you take your blood pressure?"     now 3. HOW: "How did you obtain the blood pressure?" (e.g., visiting nurse, automatic home BP monitor)     Automatic home BP monitor 4. HISTORY: "Do you have a history of high blood pressure?"     no 5. MEDICATIONS: "Are you taking any medications for blood pressure?" "Have you missed any doses recently?"     Not on medication 6. OTHER SYMPTOMS: "Do you have any symptoms?" (e.g., headache, chest pain, blurred vision, difficulty breathing, weakness)     Dull ache headache, dizziness 7. PREGNANCY: "Is there any chance you are pregnant?" "When was your last menstrual period?"     Not pregnant. LMP ended on Dec 16 th  Protocols used: HIGH BLOOD PRESSURE-A-AH

## 2019-04-25 NOTE — ED Triage Notes (Signed)
Patient in office today has been having issues with BP x 2 months instructed to get it checked out and this morning dizziness.  Denies:Nausea,vomiting,tingling and CP  SUP:[facility]SRP

## 2019-04-27 ENCOUNTER — Ambulatory Visit: Payer: Commercial Managed Care - PPO | Admitting: Nurse Practitioner

## 2019-04-27 ENCOUNTER — Encounter: Payer: Self-pay | Admitting: Nurse Practitioner

## 2019-04-27 LAB — NOVEL CORONAVIRUS, NAA: SARS-CoV-2, NAA: NOT DETECTED

## 2019-06-14 ENCOUNTER — Other Ambulatory Visit: Payer: Self-pay

## 2019-06-14 MED ORDER — METRONIDAZOLE 500 MG PO TABS: 500.0000 mg | ORAL_TABLET | Freq: Two times a day (BID) | ORAL | 0 refills | Status: DC

## 2019-07-27 ENCOUNTER — Other Ambulatory Visit: Payer: Self-pay | Admitting: Obstetrics and Gynecology

## 2019-10-24 ENCOUNTER — Ambulatory Visit (INDEPENDENT_AMBULATORY_CARE_PROVIDER_SITE_OTHER): Payer: Commercial Managed Care - PPO | Admitting: *Deleted

## 2019-10-24 ENCOUNTER — Other Ambulatory Visit: Payer: Self-pay

## 2019-10-24 VITALS — BP 146/93 | HR 97

## 2019-10-24 DIAGNOSIS — Z3201 Encounter for pregnancy test, result positive: Secondary | ICD-10-CM

## 2019-10-24 LAB — POCT URINE PREGNANCY: Preg Test, Ur: POSITIVE — AB

## 2019-10-24 NOTE — Progress Notes (Signed)
Patient seen and assessed by nursing staff.  Agree with documentation and plan.  

## 2019-10-24 NOTE — Progress Notes (Signed)
Pt here today for UPT. Pt had a positive UPT at home. LMP 5/25/202.   UPT in office positive. Per LMP pt is 6.0 weeks.    PT not currently taking any medications, advised to start taking PNV and we will see her about 5 weeks for New OB visit.

## 2019-10-31 ENCOUNTER — Ambulatory Visit (INDEPENDENT_AMBULATORY_CARE_PROVIDER_SITE_OTHER): Payer: Commercial Managed Care - PPO | Admitting: Family Medicine

## 2019-10-31 ENCOUNTER — Other Ambulatory Visit: Payer: Self-pay

## 2019-10-31 ENCOUNTER — Encounter: Payer: Self-pay | Admitting: Family Medicine

## 2019-10-31 VITALS — BP 142/89 | HR 92 | Temp 98.5°F | Wt 141.0 lb

## 2019-10-31 DIAGNOSIS — Z3491 Encounter for supervision of normal pregnancy, unspecified, first trimester: Secondary | ICD-10-CM | POA: Diagnosis not present

## 2019-10-31 DIAGNOSIS — R030 Elevated blood-pressure reading, without diagnosis of hypertension: Secondary | ICD-10-CM

## 2019-10-31 MED ORDER — LABETALOL HCL 100 MG PO TABS: 50.0000 mg | ORAL_TABLET | Freq: Two times a day (BID) | ORAL | 0 refills | Status: DC

## 2019-10-31 NOTE — Progress Notes (Signed)
BP (!) 142/89   Pulse 92   Temp 98.5 F (36.9 C) (Oral)   Wt 141 lb (64 kg)   LMP 09/12/2019 (Exact Date)   SpO2 99%   BMI 25.79 kg/m    Subjective:    Patient ID: Janet Villanueva, female    DOB: [birth year], 24 y.o.   MRN: [id number]  HPI: Janet Villanueva is a 24 y.o. female  Chief Complaint  Patient presents with  . Hypertension     xa few months now  . Headache  . Dizziness   Here today concerned about ongoing blood pressure issues. Has been noticing elevated blood pressures the past year when at doctors appointments (sometimes as high as 140s/100s), and now checking readings at home the past few weeks. Notes dizziness and headaches at times when BP is particularly high. Denies hx prior to this of elevated blood pressures and has never been on medication for this. Denies CP, SOB, syncope, drug use.   Of note, patient recently discovered that she was pregnant. LMP was 08/13/19. Has not yet established with OBGYN but plans to in the next few weeks for prenatal care. Some nausea, but no cramping, bleeding, discharge, abdominal pain. This is her first pregnancy. Taking prenatal vitamins, trying to eat healthy and exercise, not drinking or smoking.   Relevant past medical, surgical, family and social history reviewed and updated as indicated. Interim medical history since our last visit reviewed. Allergies and medications reviewed and updated.  Review of Systems  Per HPI unless specifically indicated above     Objective:    BP (!) 142/89   Pulse 92   Temp 98.5 F (36.9 C) (Oral)   Wt 141 lb (64 kg)   LMP 09/12/2019 (Exact Date)   SpO2 99%   BMI 25.79 kg/m   Wt Readings from Last 3 Encounters:  10/31/19 141 lb (64 kg)  04/25/19 136 lb (61.7 kg)  03/13/19 136 lb 11 oz (62 kg)    Physical Exam Vitals and nursing note reviewed.  Constitutional:      Appearance: Normal appearance. She is not ill-appearing.  HENT:     Head: Atraumatic.  Eyes:      Extraocular Movements: Extraocular movements intact.     Conjunctiva/sclera: Conjunctivae normal.  Cardiovascular:     Rate and Rhythm: Normal rate and regular rhythm.     Heart sounds: Normal heart sounds.  Pulmonary:     Effort: Pulmonary effort is normal.     Breath sounds: Normal breath sounds.  Abdominal:     General: Bowel sounds are normal.     Tenderness: There is no abdominal tenderness.  Musculoskeletal:        General: Normal range of motion.     Cervical back: Normal range of motion and neck supple.  Skin:    General: Skin is warm and dry.  Neurological:     General: No focal deficit present.     Mental Status: She is alert and oriented to person, place, and time.  Psychiatric:        Mood and Affect: Mood normal.        Thought Content: Thought content normal.        Judgment: Judgment normal.     Results for orders placed or performed in visit on 10/24/19  POCT urine pregnancy  Result Value Ref Range   Preg Test, Ur Positive (A) Negative      Assessment & Plan:   Problem List Items Addressed  This Visit    None    Visit Diagnoses    Elevated blood pressure reading    -  Primary   will check some basic labs, discussed starting labetalol low dose to help bring down BPs. F/u with OBGYN or here in 2 weeks for recheck   Relevant Orders   CBC with Differential/Platelet   Comprehensive metabolic panel   TSH   First trimester pregnancy       Establish with OBGYN as soon as possible, take prenatal vitamins, healthy lifestyle reviewed. Will monitor BPs closely throughout pregnancy       Follow up plan: Return in about 2 weeks (around 11/14/2019) for BP f/u if not already in with OBGYN.

## 2019-11-01 ENCOUNTER — Encounter: Payer: Commercial Managed Care - PPO | Admitting: Obstetrics and Gynecology

## 2019-11-01 LAB — CBC WITH DIFFERENTIAL/PLATELET
Basophils Absolute: 0 10*3/uL (ref 0.0–0.2)
Basos: 1 %
EOS (ABSOLUTE): 0.1 10*3/uL (ref 0.0–0.4)
Eos: 2 %
Hematocrit: 38.7 % (ref 34.0–46.6)
Hemoglobin: 12.4 g/dL (ref 11.1–15.9)
Immature Grans (Abs): 0 10*3/uL (ref 0.0–0.1)
Immature Granulocytes: 0 %
Lymphocytes Absolute: 2.4 10*3/uL (ref 0.7–3.1)
Lymphs: 34 %
MCH: 30 pg (ref 26.6–33.0)
MCHC: 32 g/dL (ref 31.5–35.7)
MCV: 94 fL (ref 79–97)
Monocytes Absolute: 0.9 10*3/uL (ref 0.1–0.9)
Monocytes: 13 %
Neutrophils Absolute: 3.6 10*3/uL (ref 1.4–7.0)
Neutrophils: 50 %
Platelets: 276 10*3/uL (ref 150–450)
RBC: 4.13 x10E6/uL (ref 3.77–5.28)
RDW: 12.2 % (ref 11.7–15.4)
WBC: 7.1 10*3/uL (ref 3.4–10.8)

## 2019-11-01 LAB — COMPREHENSIVE METABOLIC PANEL
ALT: 15 IU/L (ref 0–32)
AST: 17 IU/L (ref 0–40)
Albumin/Globulin Ratio: 1.5 (ref 1.2–2.2)
Albumin: 4.2 g/dL (ref 3.9–5.0)
Alkaline Phosphatase: 61 IU/L (ref 48–121)
BUN/Creatinine Ratio: 10 (ref 9–23)
BUN: 8 mg/dL (ref 6–20)
Bilirubin Total: 0.6 mg/dL (ref 0.0–1.2)
CO2: 22 mmol/L (ref 20–29)
Calcium: 9.7 mg/dL (ref 8.7–10.2)
Chloride: 101 mmol/L (ref 96–106)
Creatinine, Ser: 0.78 mg/dL (ref 0.57–1.00)
GFR calc Af Amer: 123 mL/min/{1.73_m2} (ref >59)
GFR calc non Af Amer: 107 mL/min/{1.73_m2} (ref >59)
Globulin, Total: 2.8 g/dL (ref 1.5–4.5)
Glucose: 86 mg/dL (ref 65–99)
Potassium: 4.2 mmol/L (ref 3.5–5.2)
Sodium: 132 mmol/L — ABNORMAL LOW (ref 134–144)
Total Protein: 7 g/dL (ref 6.0–8.5)

## 2019-11-01 LAB — TSH: TSH: 1.7 u[IU]/mL (ref 0.450–4.500)

## 2019-11-03 ENCOUNTER — Emergency Department
Admission: EM | Admit: 2019-11-03 | Discharge: 2019-11-03 | Disposition: A | Discharge status: Home / Self Care | Payer: Commercial Managed Care - PPO | Attending: Emergency Medicine | Admitting: Emergency Medicine

## 2019-11-03 ENCOUNTER — Other Ambulatory Visit: Payer: Self-pay

## 2019-11-03 ENCOUNTER — Ambulatory Visit: Payer: Self-pay

## 2019-11-03 DIAGNOSIS — O219 Vomiting of pregnancy, unspecified: Secondary | ICD-10-CM | POA: Diagnosis present

## 2019-11-03 DIAGNOSIS — Z3A08 8 weeks gestation of pregnancy: Secondary | ICD-10-CM | POA: Diagnosis not present

## 2019-11-03 LAB — CBC
HCT: 38.1 % (ref 36.0–46.0)
Hemoglobin: 13.3 g/dL (ref 12.0–15.0)
MCH: 30.9 pg (ref 26.0–34.0)
MCHC: 34.9 g/dL (ref 30.0–36.0)
MCV: 88.6 fL (ref 80.0–100.0)
Platelets: 297 10*3/uL (ref 150–400)
RBC: 4.3 MIL/uL (ref 3.87–5.11)
RDW: 13.1 % (ref 11.5–15.5)
WBC: 6.7 10*3/uL (ref 4.0–10.5)
nRBC: 0 % (ref 0.0–0.2)

## 2019-11-03 LAB — COMPREHENSIVE METABOLIC PANEL
ALT: 18 U/L (ref 0–44)
AST: 23 U/L (ref 15–41)
Albumin: 4.4 g/dL (ref 3.5–5.0)
Alkaline Phosphatase: 48 U/L (ref 38–126)
Anion gap: 9 (ref 5–15)
BUN: 7 mg/dL (ref 6–20)
CO2: 23 mmol/L (ref 22–32)
Calcium: 9.8 mg/dL (ref 8.9–10.3)
Chloride: 104 mmol/L (ref 98–111)
Creatinine, Ser: 0.88 mg/dL (ref 0.44–1.00)
GFR calc Af Amer: >60 mL/min (ref >60)
GFR calc non Af Amer: >60 mL/min (ref >60)
Glucose, Bld: 91 mg/dL (ref 70–99)
Potassium: 4.3 mmol/L (ref 3.5–5.1)
Sodium: 136 mmol/L (ref 135–145)
Total Bilirubin: 1.2 mg/dL (ref 0.3–1.2)
Total Protein: 7.8 g/dL (ref 6.5–8.1)

## 2019-11-03 LAB — HCG, QUANTITATIVE, PREGNANCY: hCG, Beta Chain, Quant, S: 263828 m[IU]/mL — ABNORMAL HIGH (ref <5)

## 2019-11-03 LAB — LIPASE, BLOOD: Lipase: 28 U/L (ref 11–51)

## 2019-11-03 MED ORDER — SODIUM CHLORIDE 0.9 % IV SOLN: Freq: Once | INTRAVENOUS | Status: AC

## 2019-11-03 MED ORDER — ONDANSETRON HCL 4 MG/2ML IJ SOLN
4.0000 mg | Freq: Once | INTRAMUSCULAR | Status: AC
  Administered 2019-11-03: 4 mg via INTRAVENOUS
  Filled 2019-11-03: qty 2

## 2019-11-03 MED ORDER — ONDANSETRON 4 MG PO TBDP
4.0000 mg | ORAL_TABLET | Freq: Three times a day (TID) | ORAL | 1 refills | Status: DC | PRN
Start: 2019-11-03 — End: 2019-12-12

## 2019-11-03 MED ORDER — SODIUM CHLORIDE 0.9% FLUSH: 3.0000 mL | Freq: Once | INTRAVENOUS | Status: DC

## 2019-11-03 NOTE — ED Provider Notes (Signed)
°  ER Provider Note       Time seen: 7:40 PM    I have reviewed the vital signs and the nursing notes.  HISTORY   Chief Complaint Emesis and Nausea    HPI Janet Villanueva is a 24 y.o. female with a history of anemia who presents today for nausea vomiting.  Patient states she was recently prescribed labetalol for blood pressure and not sure if that was causing it or the pregnancy.  She states she is 8 weeks, has not had vaginal bleeding or abdominal pain.  Past Medical History:  Diagnosis Date   Anemia     Past Surgical History:  Procedure Laterality Date   TONSILLECTOMY     TONSILLECTOMY      Allergies Patient has no known allergies.  Review of Systems Constitutional: Negative for fever. Cardiovascular: Negative for chest pain. Respiratory: Negative for shortness of breath. Gastrointestinal: Negative for abdominal pain, positive for vomiting Musculoskeletal: Negative for back pain. Skin: Negative for rash. Neurological: Negative for headaches, focal weakness or numbness.  All systems negative/normal/unremarkable except as stated in the HPI  ____________________________________________   PHYSICAL EXAM:  VITAL SIGNS: Vitals:   11/03/19 1620 11/03/19 1923  BP: (!) 140/93 137/82  Pulse: 85 95  Resp: 18 18  Temp: 98.5 F (36.9 C)   SpO2: 100% 100%    Constitutional: Alert and oriented. Well appearing and in no distress. Eyes: Conjunctivae are normal. Normal extraocular movements. ENT      Head: Normocephalic and atraumatic.      Nose: No congestion/rhinnorhea.      Mouth/Throat: Mucous membranes are moist.      Neck: No stridor. Cardiovascular: Normal rate, regular rhythm. No murmurs, rubs, or gallops. Respiratory: Normal respiratory effort without tachypnea nor retractions. Breath sounds are clear and equal bilaterally. No wheezes/rales/rhonchi. Gastrointestinal: Soft and nontender. Normal bowel sounds Musculoskeletal: Nontender with normal  range of motion in extremities. No lower extremity tenderness nor edema. Neurologic:  Normal speech and language. No gross focal neurologic deficits are appreciated.  Skin:  Skin is warm, dry and intact. No rash noted. Psychiatric: Speech and behavior are normal.  ____________________________________________   LABS (pertinent positives/negatives)  Labs Reviewed  LIPASE, BLOOD  COMPREHENSIVE METABOLIC PANEL  CBC  URINALYSIS, COMPLETE (UACMP) WITH MICROSCOPIC  HCG, QUANTITATIVE, PREGNANCY  POC URINE PREG, ED   DIFFERENTIAL DIAGNOSIS  Dehydration, electrolyte abnormality, normal pregnancy  ASSESSMENT AND PLAN  Nausea and vomiting in early pregnancy   Plan: The patient had presented for nausea and vomiting in early pregnancy. Patient's labs were reassuring.  She was given fluids and Zofran and is cleared for outpatient follow-up.  Daryel November MD    Note: This note was generated in part or whole with voice recognition software. Voice recognition is usually quite accurate but there are transcription errors that can and very often do occur. I apologize for any typographical errors that were not detected and corrected.     Emily Filbert, MD 11/03/19 [phone]

## 2019-11-03 NOTE — Telephone Encounter (Signed)
Agree with disposition. 

## 2019-11-03 NOTE — Telephone Encounter (Signed)
Patient called and says she's been vomiting x 3 days, not able to keep anything down not even water. She says she's been sipping on the water. She's about [redacted] weeks pregnant and has an OB appointment in August. She says she has ginger that she's been trying to help with the nausea. She says she has a headache, chills, no thermometer to check her temperature. She says she urinated dark yellow urine about 12 hours ago, around 0100-0200 this morning and has no urge to urinate. I advised her to go to the ED for evaluation, care advice given, she verbalized understanding.   Reason for Disposition . [1] Drinking very little AND [2] dehydration suspected (e.g., no urine > 12 hours, very dry mouth, very lightheaded)  Answer Assessment - Initial Assessment Questions 1. VOMITING SEVERITY: "How many times have you vomited in the past 24 hours?"     - MILD:  1 - 2 times/day    - MODERATE: 3 - 5 times/day, decreased oral intake without significant weight loss or symptoms of dehydration    - SEVERE: 6 or more times/day, vomits everything or nearly everything, with significant weight loss, symptoms of dehydration      Moderate 2. ONSET: "When did the vomiting begin?"      3 days ago 3. FLUIDS: "What fluids or food have you vomited up today?" "Are you able to keep any liquids down?"     Water sips, not able to keep it down 4. TREATMENT: "What have you been doing so far to treat this?"      Ginger 5. DEHYDRATION: "When was the last time you urinated?" "Are you feeling lightheaded?" "Weight loss?"     Not lightheaded, last urination 12 hours ago-dark yellow 6. PREGNANCY: "How many weeks pregnant are you?" "How has the pregnancy been going?"     8 weeks; no pregnancy problems so far 7. EDD: "What date are you expecting to deliver?"     Unknown 8. MEDICATIONS: "What medications are you taking?" (e.g., prenatal vitamins, iron)     Prenatal vitamins 9. OTHER SYMPTOMS: "Do you have any other symptoms?"      Headache, chills  Protocols used: PREGNANCY - MORNING SICKNESS (NAUSEA AND VOMITING OF PREGNANCY)-A-AH

## 2019-11-03 NOTE — ED Triage Notes (Signed)
Pt comes via POV from home with c/o nausea and vomiting. Pt states she was recently prescribed labetalol for her BP and not sure if that is what is causing it or the pregnancy.  Pt states she is [redacted] weeks pregnant. Pt has not had 1st appt yet.  Pt denies any vaginal bleeding or pain.

## 2019-11-15 ENCOUNTER — Ambulatory Visit (INDEPENDENT_AMBULATORY_CARE_PROVIDER_SITE_OTHER): Payer: Commercial Managed Care - PPO | Admitting: Family Medicine

## 2019-11-15 ENCOUNTER — Other Ambulatory Visit: Payer: Self-pay

## 2019-11-15 ENCOUNTER — Encounter: Payer: Self-pay | Admitting: Family Medicine

## 2019-11-15 VITALS — BP 121/81 | HR 96 | Temp 99.0°F | Wt 138.0 lb

## 2019-11-15 DIAGNOSIS — B9689 Other specified bacterial agents as the cause of diseases classified elsewhere: Secondary | ICD-10-CM

## 2019-11-15 DIAGNOSIS — O10019 Pre-existing essential hypertension complicating pregnancy, unspecified trimester: Secondary | ICD-10-CM | POA: Insufficient documentation

## 2019-11-15 DIAGNOSIS — I10 Essential (primary) hypertension: Secondary | ICD-10-CM | POA: Diagnosis not present

## 2019-11-15 DIAGNOSIS — N760 Acute vaginitis: Secondary | ICD-10-CM

## 2019-11-15 MED ORDER — METRONIDAZOLE 0.75 % VA GEL: 1.0000 | Freq: Two times a day (BID) | VAGINAL | 1 refills | Status: DC

## 2019-11-15 MED ORDER — LABETALOL HCL 100 MG PO TABS: 50.0000 mg | ORAL_TABLET | Freq: Two times a day (BID) | ORAL | 2 refills | Status: DC

## 2019-11-15 NOTE — Assessment & Plan Note (Signed)
Discussed probiotics, flagyl gel prn. F/u with OBGYN as scheduled

## 2019-11-15 NOTE — Progress Notes (Signed)
BP 121/81   Pulse 96   Temp 99 F (37.2 C) (Oral)   Wt 138 lb (62.6 kg)   LMP 09/12/2019 (Exact Date)   SpO2 99%   BMI 22.96 kg/m    Subjective:    Patient ID: Janet Villanueva, female    DOB: 1995-12-19, 24 y.o.   MRN: [id number]  HPI: Janet Villanueva is a 24 y.o. female  Chief Complaint  Patient presents with  . Hypertension   Here today for 2 week BP f/u since starting BID labetalol. Taking 1/2 tab BID, tolerating very well and notes home BPs running 120s/80s with HRs in 80s-90s range. Denies dizziness, CP, SOB, fatigue. Of note, in first trimester of pregnancy. To establish next week with OBGYN. Having some nausea but otherwise feeling well overall.   Still having issues with vaginal odor and discharge, hx of recurrent BV. Practicing good vaginal hygiene. Denies fever, chills, rashes, abdominal pain, concern for STIs.   Relevant past medical, surgical, family and social history reviewed and updated as indicated. Interim medical history since our last visit reviewed. Allergies and medications reviewed and updated.  Review of Systems  Per HPI unless specifically indicated above     Objective:    BP 121/81   Pulse 96   Temp 99 F (37.2 C) (Oral)   Wt 138 lb (62.6 kg)   LMP 09/12/2019 (Exact Date)   SpO2 99%   BMI 22.96 kg/m   Wt Readings from Last 3 Encounters:  11/15/19 138 lb (62.6 kg)  11/03/19 141 lb (64 kg)  10/31/19 141 lb (64 kg)    Physical Exam Vitals and nursing note reviewed.  Constitutional:      Appearance: Normal appearance. She is not ill-appearing.  HENT:     Head: Atraumatic.  Eyes:     Extraocular Movements: Extraocular movements intact.     Conjunctiva/sclera: Conjunctivae normal.  Cardiovascular:     Rate and Rhythm: Normal rate and regular rhythm.     Heart sounds: Normal heart sounds.  Pulmonary:     Effort: Pulmonary effort is normal.     Breath sounds: Normal breath sounds.  Abdominal:     General: Bowel sounds  are normal. There is no distension.     Palpations: Abdomen is soft.     Tenderness: There is no abdominal tenderness. There is no guarding.  Musculoskeletal:        General: Normal range of motion.     Cervical back: Normal range of motion and neck supple.  Skin:    General: Skin is warm and dry.  Neurological:     Mental Status: She is alert and oriented to person, place, and time.  Psychiatric:        Mood and Affect: Mood normal.        Thought Content: Thought content normal.        Judgment: Judgment normal.     Results for orders placed or performed during the hospital encounter of 11/03/19  Lipase, blood  Result Value Ref Range   Lipase 28 11 - 51 U/L  Comprehensive metabolic panel  Result Value Ref Range   Sodium 136 135 - 145 mmol/L   Potassium 4.3 3.5 - 5.1 mmol/L   Chloride 104 98 - 111 mmol/L   CO2 23 22 - 32 mmol/L   Glucose, Bld 91 70 - 99 mg/dL   BUN 7 6 - 20 mg/dL   Creatinine, Ser 3.71 0.44 - 1.00 mg/dL   Calcium  9.8 8.9 - 10.3 mg/dL   Total Protein 7.8 6.5 - 8.1 g/dL   Albumin 4.4 3.5 - 5.0 g/dL   AST 23 15 - 41 U/L   ALT 18 0 - 44 U/L   Alkaline Phosphatase 48 38 - 126 U/L   Total Bilirubin 1.2 0.3 - 1.2 mg/dL   GFR calc non Af Amer >60 >60 mL/min   GFR calc Af Amer >60 >60 mL/min   Anion gap 9 5 - 15  CBC  Result Value Ref Range   WBC 6.7 4.0 - 10.5 K/uL   RBC 4.30 3.87 - 5.11 MIL/uL   Hemoglobin 13.3 12.0 - 15.0 g/dL   HCT 94.1 36 - 46 %   MCV 88.6 80.0 - 100.0 fL   MCH 30.9 26.0 - 34.0 pg   MCHC 34.9 30.0 - 36.0 g/dL   RDW 74.0 81.4 - 48.1 %   Platelets 297 150 - 400 K/uL   nRBC 0.0 0.0 - 0.2 %  hCG, quantitative, pregnancy  Result Value Ref Range   hCG, Beta Chain, Quant, S 263,828 (H) <5 mIU/mL      Assessment & Plan:   Problem List Items Addressed This Visit      Cardiovascular and Mediastinum   Essential hypertension - Primary    Much improved with labetalol, continue current regimen with close monitoring. F/u in 1 month if  monitoring not taken over by OBGYN during routine prenatal visits      Relevant Medications   labetalol (NORMODYNE) 100 MG tablet     Genitourinary   BV (bacterial vaginosis)    Discussed probiotics, flagyl gel prn. F/u with OBGYN as scheduled          Follow up plan: Return for OBGYN as scheduled.

## 2019-11-15 NOTE — Assessment & Plan Note (Signed)
Much improved with labetalol, continue current regimen with close monitoring. F/u in 1 month if monitoring not taken over by OBGYN during routine prenatal visits

## 2019-11-27 ENCOUNTER — Encounter: Payer: Commercial Managed Care - PPO | Admitting: Family Medicine

## 2019-12-12 ENCOUNTER — Ambulatory Visit (INDEPENDENT_AMBULATORY_CARE_PROVIDER_SITE_OTHER): Payer: Commercial Managed Care - PPO | Admitting: Nurse Practitioner

## 2019-12-12 ENCOUNTER — Encounter: Payer: Self-pay | Admitting: Unknown Physician Specialty

## 2019-12-12 ENCOUNTER — Encounter: Payer: Self-pay | Admitting: Nurse Practitioner

## 2019-12-12 ENCOUNTER — Other Ambulatory Visit: Payer: Self-pay

## 2019-12-12 VITALS — BP 109/70 | HR 90 | Temp 98.4°F | Ht 65.0 in | Wt 133.0 lb

## 2019-12-12 DIAGNOSIS — I10 Essential (primary) hypertension: Secondary | ICD-10-CM

## 2019-12-12 DIAGNOSIS — G44201 Tension-type headache, unspecified, intractable: Secondary | ICD-10-CM | POA: Diagnosis not present

## 2019-12-12 MED ORDER — CYCLOBENZAPRINE HCL 5 MG PO TABS
5.0000 mg | ORAL_TABLET | Freq: Three times a day (TID) | ORAL | 0 refills | Status: DC | PRN
Start: 2019-12-12 — End: 2021-05-30

## 2019-12-12 MED ORDER — KETOROLAC TROMETHAMINE 60 MG/2ML IM SOLN
60.0000 mg | Freq: Once | INTRAMUSCULAR | Status: AC
Start: 2019-12-12 — End: 2019-12-12
  Administered 2019-12-12: 60 mg via INTRAMUSCULAR

## 2019-12-12 NOTE — Assessment & Plan Note (Signed)
Acute, ongoing.  Toradol 60 mg injection given in office today.  If this does not get rid of headache, can try cyclobenzaprine 5 mg for tension.  Also encouraged to obtain COVID-19 testing and appointment with Eye Dr.

## 2019-12-12 NOTE — Assessment & Plan Note (Signed)
Chronic, stable.  Patient reports she is no longer pregnant and appears to be well-controlled with labetalol.  If BP >140/90 or <100/60, return to clinic.  May consider alternative in future if patient desires daily dosing of blood pressure medication.  Continue labetalol at current dose.

## 2019-12-12 NOTE — Progress Notes (Signed)
BP 109/70 (BP Location: Left Arm, Patient Position: Sitting, Cuff Size: Normal)   Pulse 90   Temp 98.4 F (36.9 C) (Oral)   Ht 5\' 5"  (1.651 m)   Wt 133 lb (60.3 kg)   LMP 09/12/2019 (Exact Date)   SpO2 100%   Breastfeeding Unknown   BMI 22.13 kg/m    Subjective:    Patient ID: 09/14/2019, female    DOB: 06-17-1995, 24 y.o.   MRN: [id number]  HPI: Janet Villanueva is a 24 y.o. female presenting for blood pressure follow up.  Chief Complaint  Patient presents with  . Headache    Ongoing 3 days  . Hypertension    Somedays had elevated blood pressure but the last 2 days BP has been normal.   MIGRAINES Patient reports history of headaches - needed glasses at the time and that seemed to help headaches.  Duration: days Onset: sudden Severity: 9/10 Quality: throbbing Frequency: constant Location: both sides of temples Headache duration: 3 days Radiation: no Time of day headache occurs: all day Alleviating factors: BC powder, Ibuprofen 800 mg Aggravating factors: light, sounds,  Headache status at time of visit: current headache Treatments attempted: ibuprofen 800 mg, BC powder Aura: no Nausea:  yes Vomiting: no Photophobia:  yes Phonophobia:  yes Effect on social functioning:  yes  Decreased appetite: yes Confusion:  no Gait disturbance/ataxia:  no Behavioral changes:  no Fevers:  no  HYPERTENSION Hypertension status:  controlled  Satisfied with current treatment? yes Duration of hypertension: chronic BP monitoring frequency:  weekly BP range: 120s/80-90 ; gets as high as 140s BP medication side effects:  no Medication compliance: excellent compliance Previous BP meds: labetalol  Aspirin: no Recurrent headaches: no Visual changes: no Palpitations: no Dyspnea: no Chest pain: no Lower extremity edema: no Dizzy/lightheaded: no  No Known Allergies  Outpatient Encounter Medications as of 12/12/2019  Medication Sig  . labetalol  (NORMODYNE) 100 MG tablet Take 0.5 tablets (50 mg total) by mouth 2 (two) times daily.  . cyclobenzaprine (FLEXERIL) 5 MG tablet Take 1 tablet (5 mg total) by mouth 3 (three) times daily as needed for muscle spasms. Take first dose at night and monitor for lightheadedness or drowsiness.  Do not drive or operate heavy machinery while taking.  . [DISCONTINUED] metroNIDAZOLE (METROGEL VAGINAL) 0.75 % vaginal gel Place 1 Applicatorful vaginally 2 (two) times daily. (Patient not taking: Reported on 12/12/2019)  . [DISCONTINUED] ondansetron (ZOFRAN ODT) 4 MG disintegrating tablet Take 1 tablet (4 mg total) by mouth every 8 (eight) hours as needed for nausea or vomiting. (Patient not taking: Reported on 12/12/2019)   Facility-Administered Encounter Medications as of 12/12/2019  Medication  . ketorolac (TORADOL) injection 60 mg   Patient Active Problem List   Diagnosis Date Noted  . Acute intractable tension-type headache 12/12/2019  . Essential hypertension 11/15/2019  . BV (bacterial vaginosis) 06/28/2018  . Heart palpitations 10/27/2016  . IBS (irritable bowel syndrome) 05/20/2015  . Contraception management 02/28/2013  . Vaginal pain 08/27/2011   Past Medical History:  Diagnosis Date  . Anemia    Relevant past medical, surgical, family and social history reviewed and updated as indicated. Interim medical history since our last visit reviewed.  Review of Systems  Constitutional: Positive for appetite change. Negative for fatigue and fever.  HENT: Negative.   Eyes: Positive for photophobia and discharge. Negative for pain, redness and itching.  Respiratory: Negative.   Cardiovascular: Negative.   Gastrointestinal: Negative.   Skin: Negative.  Neurological: Positive for headaches. Negative for dizziness, weakness and light-headedness.  Psychiatric/Behavioral: Negative.  Negative for agitation and confusion.    Per HPI unless specifically indicated above     Objective:    BP 109/70  (BP Location: Left Arm, Patient Position: Sitting, Cuff Size: Normal)   Pulse 90   Temp 98.4 F (36.9 C) (Oral)   Ht 5\' 5"  (1.651 m)   Wt 133 lb (60.3 kg)   LMP 09/12/2019 (Exact Date)   SpO2 100%   Breastfeeding Unknown   BMI 22.13 kg/m   Wt Readings from Last 3 Encounters:  12/12/19 133 lb (60.3 kg)  11/15/19 138 lb (62.6 kg)  11/03/19 141 lb (64 kg)    Physical Exam Vitals and nursing note reviewed.  Constitutional:      General: She is not in acute distress.    Appearance: She is well-developed. She is not toxic-appearing.  HENT:     Head: Normocephalic and atraumatic.  Eyes:     General: No scleral icterus.    Extraocular Movements: Extraocular movements intact.  Cardiovascular:     Rate and Rhythm: Normal rate.  Pulmonary:     Effort: Pulmonary effort is normal. No respiratory distress.  Musculoskeletal:     Cervical back: Normal range of motion.  Skin:    General: Skin is warm and dry.  Neurological:     Mental Status: She is alert.     Motor: No weakness.     Gait: Gait normal.  Psychiatric:        Mood and Affect: Mood normal. Mood is not anxious or depressed.        Speech: Speech normal.        Behavior: Behavior normal.      Assessment & Plan:   Problem List Items Addressed This Visit      Cardiovascular and Mediastinum   Essential hypertension    Chronic, stable.  Patient reports she is no longer pregnant and appears to be well-controlled with labetalol.  If BP >140/90 or <100/60, return to clinic.  May consider alternative in future if patient desires daily dosing of blood pressure medication.  Continue labetalol at current dose.        Other   Acute intractable tension-type headache - Primary    Acute, ongoing.  Toradol 60 mg injection given in office today.  If this does not get rid of headache, can try cyclobenzaprine 5 mg for tension.  Also encouraged to obtain COVID-19 testing and appointment with Eye Dr.       Relevant Medications    ketorolac (TORADOL) injection 60 mg   cyclobenzaprine (FLEXERIL) 5 MG tablet       Follow up plan: Return if symptoms worsen or fail to improve.

## 2019-12-12 NOTE — Patient Instructions (Signed)
Tension Headache, Adult A tension headache is pain, pressure, or aching in your head. Tension headaches can last from 30 minutes to several days. Follow these instructions at home: Managing pain  Take over-the-counter and prescription medicines only as told by your doctor.  When you have a headache, lie down in a dark, quiet room.  If told, put ice on your head and neck: ? Put ice in a plastic bag. ? Place a towel between your skin and the bag. ? Leave the ice on for 20 minutes, 2-3 times a day.  If told, put heat on the back of your neck. Do this as often as your doctor tells you to. Use the kind of heat that your doctor recommends, such as a moist heat pack or a heating pad. ? Place a towel between your skin and the heat. ? Leave the heat on for 20-30 minutes. ? Remove the heat if your skin turns bright red. Eating and drinking  Eat meals on a regular schedule.  Watch how much alcohol you drink: ? If you are a woman and are not pregnant, do not drink more than 1 drink a day. ? If you are a man, do not drink more than 2 drinks a day.  Drink enough fluid to keep your pee (urine) pale yellow.  Do not use a lot of caffeine, or stop using caffeine. Lifestyle  Get enough sleep. Get 7-9 hours of sleep each night. Or get the amount of sleep that your doctor tells you to.  At bedtime, remove all electronic devices from your room. Examples of electronic devices are computers, phones, and tablets.  Find ways to lessen your stress. Some things that can lessen stress are: ? Exercise. ? Deep breathing. ? Yoga. ? Music. ? Positive thoughts.  Sit up straight. Do not tighten (tense) your muscles.  Do not use any products that have nicotine or tobacco in them, such as cigarettes and e-cigarettes. If you need help quitting, ask your doctor. General instructions   Keep all follow-up visits as told by your doctor. This is important.  Avoid things that can bring on headaches. Keep a  journal to find out if certain things bring on headaches. For example, write down: ? What you eat and drink. ? How much sleep you get. ? Any change to your diet or medicines. Contact a doctor if:  Your headache does not get better.  Your headache comes back.  You have a headache and sounds, light, or smells bother you.  You feel sick to your stomach (nauseous) or you throw up (vomit).  Your stomach hurts. Get help right away if:  You suddenly get a very bad headache along with any of these: ? A stiff neck. ? Feeling sick to your stomach. ? Throwing up. ? Feeling weak. ? Trouble seeing. ? Feeling short of breath. ? A rash. ? Feeling unusually sleepy. ? Trouble speaking. ? Pain in your eye or ear. ? Trouble walking or balancing. ? Feeling like you will pass out (faint). ? Passing out. Summary  A tension headache is pain, pressure, or aching in your head.  Tension headaches can last from 30 minutes to several days.  Lifestyle changes and medicines may help relieve pain. This information is not intended to replace advice given to you by your health care provider. Make sure you discuss any questions you have with your health care provider. Document Revised: 02/01/2019 Document Reviewed: 07/17/2016 Elsevier Patient Education  2020 [employer]vier Inc.  

## 2019-12-18 ENCOUNTER — Ambulatory Visit: Payer: Commercial Managed Care - PPO | Admitting: Family Medicine

## 2020-01-28 ENCOUNTER — Other Ambulatory Visit: Payer: Self-pay | Admitting: Family Medicine

## 2020-01-28 NOTE — Telephone Encounter (Signed)
° °  Notes to clinic: REQUEST FOR 90 DAYS PRESCRIPTION with one refill 30 day with 2 refills were sent  Review for change    Requested Prescriptions  Pending Prescriptions Disp Refills   labetalol (NORMODYNE) 100 MG tablet [Pharmacy Med Name: LABETALOL HCL 100 MG TABLET] 90 tablet 1    Sig: TAKE 1/2 TABLETS (50 MG TOTAL) BY MOUTH 2 (TWO) TIMES DAILY.      Cardiovascular:  Beta Blockers Passed - 01/28/2020  9:06 AM      Passed - Last BP in normal range    BP Readings from Last 1 Encounters:  12/12/19 109/70          Passed - Last Heart Rate in normal range    Pulse Readings from Last 1 Encounters:  12/12/19 90          Passed - Valid encounter within last 6 months    Recent Outpatient Visits           1 month ago Acute intractable tension-type headache   [facility] Valentino Nose, NP   2 months ago Essential hypertension   [facility] Roosvelt Maser [place], [place]   2 months ago Elevated blood pressure reading   [facility], [place], [place]   3 years ago Acute midline low back pain without sciatica   [facility] Gabriel Cirri, NP   3 years ago Encounter for pre-employment examination   [facility] Particia Nearing, [place]

## 2020-02-25 ENCOUNTER — Emergency Department
Admission: EM | Admit: 2020-02-25 | Discharge: 2020-02-25 | Disposition: A | Discharge status: Home / Self Care | Payer: Commercial Managed Care - PPO | Attending: Emergency Medicine | Admitting: Emergency Medicine

## 2020-02-25 ENCOUNTER — Other Ambulatory Visit: Payer: Self-pay

## 2020-02-25 ENCOUNTER — Encounter: Payer: Self-pay | Admitting: Emergency Medicine

## 2020-02-25 DIAGNOSIS — S4992XA Unspecified injury of left shoulder and upper arm, initial encounter: Secondary | ICD-10-CM | POA: Diagnosis present

## 2020-02-25 DIAGNOSIS — Z79899 Other long term (current) drug therapy: Secondary | ICD-10-CM | POA: Diagnosis not present

## 2020-02-25 DIAGNOSIS — I10 Essential (primary) hypertension: Secondary | ICD-10-CM | POA: Insufficient documentation

## 2020-02-25 DIAGNOSIS — F10929 Alcohol use, unspecified with intoxication, unspecified: Secondary | ICD-10-CM | POA: Insufficient documentation

## 2020-02-25 DIAGNOSIS — S51811A Laceration without foreign body of right forearm, initial encounter: Secondary | ICD-10-CM

## 2020-02-25 DIAGNOSIS — Y905 Blood alcohol level of 100-119 mg/100 ml: Secondary | ICD-10-CM | POA: Diagnosis not present

## 2020-02-25 DIAGNOSIS — S50811A Abrasion of right forearm, initial encounter: Secondary | ICD-10-CM | POA: Insufficient documentation

## 2020-02-25 LAB — CBC
HCT: 40.6 % (ref 36.0–46.0)
Hemoglobin: 13.2 g/dL (ref 12.0–15.0)
MCH: 28.9 pg (ref 26.0–34.0)
MCHC: 32.5 g/dL (ref 30.0–36.0)
MCV: 89 fL (ref 80.0–100.0)
Platelets: 312 10*3/uL (ref 150–400)
RBC: 4.56 MIL/uL (ref 3.87–5.11)
RDW: 13.6 % (ref 11.5–15.5)
WBC: 6.8 10*3/uL (ref 4.0–10.5)
nRBC: 0 % (ref 0.0–0.2)

## 2020-02-25 LAB — BASIC METABOLIC PANEL
Anion gap: 8 (ref 5–15)
BUN: 16 mg/dL (ref 6–20)
CO2: 24 mmol/L (ref 22–32)
Calcium: 9.5 mg/dL (ref 8.9–10.3)
Chloride: 108 mmol/L (ref 98–111)
Creatinine, Ser: 1.18 mg/dL — ABNORMAL HIGH (ref 0.44–1.00)
GFR, Estimated: >60 mL/min (ref >60)
Glucose, Bld: 92 mg/dL (ref 70–99)
Potassium: 4 mmol/L (ref 3.5–5.1)
Sodium: 140 mmol/L (ref 135–145)

## 2020-02-25 LAB — ETHANOL: Alcohol, Ethyl (B): 231 mg/dL — ABNORMAL HIGH (ref <10)

## 2020-02-25 MED ORDER — LIDOCAINE HCL (PF) 1 % IJ SOLN
5.0000 mL | Freq: Once | INTRAMUSCULAR | Status: AC
  Administered 2020-02-25: 5 mL
  Filled 2020-02-25: qty 5

## 2020-02-25 MED ORDER — LORAZEPAM 2 MG/ML IJ SOLN
INTRAMUSCULAR | Status: AC
  Filled 2020-02-25: qty 1

## 2020-02-25 MED ORDER — SODIUM CHLORIDE 0.9 % IV BOLUS
1000.0000 mL | Freq: Once | INTRAVENOUS | Status: AC
  Administered 2020-02-25: 1000 mL via INTRAVENOUS

## 2020-02-25 MED ORDER — LORAZEPAM 2 MG/ML IJ SOLN
0.5000 mg | Freq: Once | INTRAMUSCULAR | Status: AC
  Administered 2020-02-25: 0.5 mg via INTRAVENOUS

## 2020-02-25 MED ORDER — TETANUS-DIPHTH-ACELL PERTUSSIS 5-2.5-18.5 LF-MCG/0.5 IM SUSY: 0.5000 mL | PREFILLED_SYRINGE | Freq: Once | INTRAMUSCULAR | Status: DC
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
## 2020-02-25 NOTE — ED Notes (Signed)
EDP made aware patient now resting in bed with eyes closed, this RN attempted to wake patient up, pt with stable VS, mild snoring respirations audible. Per EDP okay to wait for patient to wake up and call for ride.

## 2020-02-25 NOTE — ED Notes (Signed)
Pt resting in bed speaking on phone with her mother. Pt appears more calm at this time. EDP at bedside performing laceration repair at this time.

## 2020-02-25 NOTE — ED Notes (Signed)
EDP at bedside to provide lidocaine for laceration repair, pt given phone to speak with mother after pt's mother updated by this RN with patient permission.
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
## 2020-02-25 NOTE — ED Notes (Signed)
Pt currently resting with no distress noted at this time. Pt has response to light touch but is not awake enough at this time to call mother for a ride. VSS. Call bell in reach.

## 2020-02-25 NOTE — ED Notes (Signed)
This RN to bedside, pt assisted to bathroom by this RN. Urine sample collected by this RN and sent to lab by this RN.

## 2020-02-25 NOTE — ED Notes (Signed)
Pt assisted to the bathroom at this time by this RN, pt back to bed without incident. Pt requesting a phone to call her mom again.

## 2020-02-25 NOTE — ED Notes (Signed)
Pt given phone to speak with her mother.

## 2020-02-25 NOTE — ED Notes (Signed)
EDP made aware of patient's complaints of increased pain when she moves her fingers and feeling of increased difficulty bending/flexing her fingers at this time. No new orders.
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
## 2020-02-25 NOTE — ED Notes (Signed)
D/C and suture removal f/up discussed with pt, pt verbalized understanding. NAD noted. VSS.
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
## 2020-02-26 ENCOUNTER — Telehealth: Payer: Self-pay

## 2020-02-26 NOTE — Telephone Encounter (Signed)
Called pt no answer left vm. If pt calls back please schedule ov tomorrow  Copied from CRM [id number]. Topic: Appointment Scheduling - Scheduling Inquiry for Clinic >> Feb 26, 2020 12:56 PM Leafy Ro wrote: Reason for CRM:Pt is calling and per pt er doctor told her to follow up for her forearm laceration today

## 2020-02-27 ENCOUNTER — Ambulatory Visit: Payer: Commercial Managed Care - PPO | Admitting: Nurse Practitioner

## 2020-02-27 ENCOUNTER — Other Ambulatory Visit: Payer: Self-pay

## 2020-02-27 ENCOUNTER — Encounter: Payer: Self-pay | Admitting: Nurse Practitioner

## 2020-02-27 DIAGNOSIS — L0390 Cellulitis, unspecified: Secondary | ICD-10-CM | POA: Diagnosis not present

## 2020-02-27 MED ORDER — MUPIROCIN CALCIUM 2 % EX CREA: 1.0000 "application " | TOPICAL_CREAM | Freq: Two times a day (BID) | CUTANEOUS | 0 refills | Status: DC

## 2020-02-27 MED ORDER — DOXYCYCLINE HYCLATE 100 MG PO TABS
100.0000 mg | ORAL_TABLET | Freq: Two times a day (BID) | ORAL | 0 refills | Status: DC
Start: 2020-02-27 — End: 2021-05-30

## 2020-02-27 MED ORDER — PREDNISONE 20 MG PO TABS
40.0000 mg | ORAL_TABLET | Freq: Every day | ORAL | 0 refills | Status: AC
Start: 2020-02-27 — End: 2020-03-03

## 2020-02-27 MED ORDER — MUPIROCIN 2 % EX OINT: 1.0000 "application " | TOPICAL_OINTMENT | Freq: Two times a day (BID) | CUTANEOUS | 0 refills | Status: DC
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
## 2020-02-27 NOTE — Addendum Note (Signed)
Addended by: Aura Dials T on: 02/27/2020 04:33 PM   Modules accepted: Orders

## 2020-02-27 NOTE — Patient Instructions (Signed)
Wound Care, Adult Taking care of your wound properly can help to prevent pain, infection, and scarring. It can also help your wound to heal more quickly. How to care for your wound Wound care      Follow instructions from your health care provider about how to take care of your wound. Make sure you: ? Wash your hands with soap and water before you change the bandage (dressing). If soap and water are not available, use hand sanitizer. ? Change your dressing as told by your health care provider. ? Leave stitches (sutures), skin glue, or adhesive strips in place. These skin closures may need to stay in place for 2 weeks or longer. If adhesive strip edges start to loosen and curl up, you may trim the loose edges. Do not remove adhesive strips completely unless your health care provider tells you to do that.  Check your wound area every day for signs of infection. Check for: ? Redness, swelling, or pain. ? Fluid or blood. ? Warmth. ? Pus or a bad smell.  Ask your health care provider if you should clean the wound with mild soap and water. Doing this may include: ? Using a clean towel to pat the wound dry after cleaning it. Do not rub or scrub the wound. ? Applying a cream or ointment. Do this only as told by your health care provider. ? Covering the incision with a clean dressing.  Ask your health care provider when you can leave the wound uncovered.  Keep the dressing dry until your health care provider says it can be removed. Do not take baths, swim, use a hot tub, or do anything that would put the wound underwater until your health care provider approves. Ask your health care provider if you can take showers. You may only be allowed to take sponge baths. Medicines   If you were prescribed an antibiotic medicine, cream, or ointment, take or use the antibiotic as told by your health care provider. Do not stop taking or using the antibiotic even if your condition improves.  Take  over-the-counter and prescription medicines only as told by your health care provider. If you were prescribed pain medicine, take it 30 or more minutes before you do any wound care or as told by your health care provider. General instructions  Return to your normal activities as told by your health care provider. Ask your health care provider what activities are safe.  Do not scratch or pick at the wound.  Do not use any products that contain nicotine or tobacco, such as cigarettes and e-cigarettes. These may delay wound healing. If you need help quitting, ask your health care provider.  Keep all follow-up visits as told by your health care provider. This is important.  Eat a diet that includes protein, vitamin A, vitamin C, and other nutrient-rich foods to help the wound heal. ? Foods rich in protein include meat, dairy, beans, nuts, and other sources. ? Foods rich in vitamin A include carrots and dark green, leafy vegetables. ? Foods rich in vitamin C include citrus, tomatoes, and other fruits and vegetables. ? Nutrient-rich foods have protein, carbohydrates, fat, vitamins, or minerals. Eat a variety of healthy foods including vegetables, fruits, and whole grains. Contact a health care provider if:  You received a tetanus shot and you have swelling, severe pain, redness, or bleeding at the injection site.  Your pain is not controlled with medicine.  You have redness, swelling, or pain around the wound.    You have fluid or blood coming from the wound.  Your wound feels warm to the touch.  You have pus or a bad smell coming from the wound.  You have a fever or chills.  You are nauseous or you vomit.  You are dizzy. Get help right away if:  You have a red streak going away from your wound.  The edges of the wound open up and separate.  Your wound is bleeding, and the bleeding does not stop with gentle pressure.  You have a rash.  You faint.  You have trouble  breathing. Summary  Always wash your hands with soap and water before changing your bandage (dressing).  To help with healing, eat foods that are rich in protein, vitamin A, vitamin C, and other nutrients.  Check your wound every day for signs of infection. Contact your health care provider if you suspect that your wound is infected. This information is not intended to replace advice given to you by your health care provider. Make sure you discuss any questions you have with your health care provider. Document Revised: 07/25/2018 Document Reviewed: 10/22/2015 Elsevier Patient Education  2020 [employer]  

## 2020-02-27 NOTE — Assessment & Plan Note (Signed)
Acute and present to right forearm wound.  Will leave sutures in until visit with ortho on Friday, as drainage still present from site.  Start Doxycycline 100 MG BID x 10 days + Prednisone 5 day short burst for discomfort and inflammation.  Sent in Mupirocin ointment to place over wound with dressing changes daily.  Have recommend if sutures not removed with ortho she schedule follow-up with PCP for removal.  At this time continue to monitor area closely and follow-up with ortho as scheduled.

## 2021-05-28 ENCOUNTER — Other Ambulatory Visit: Payer: Self-pay | Admitting: Unknown Physician Specialty

## 2021-05-28 NOTE — Telephone Encounter (Signed)
Requested medication (s) are due for refill today - expired Rx  Requested medication (s) are on the active medication list -yes  Future visit scheduled -no  Last refill: 01/29/20 #90 1RF  Notes to clinic: Request RF: Attempted to call patient to schedule appointment- left message to call office, expired Rx  Requested Prescriptions  Pending Prescriptions Disp Refills   labetalol (NORMODYNE) 100 MG tablet [Pharmacy Med Name: LABETALOL HCL 100 MG TABLET] 90 tablet 1    Sig: TAKE 1/2 TABLETS (50 MG TOTAL) BY MOUTH 2 (TWO) TIMES DAILY.     Cardiovascular:  Beta Blockers Failed - 05/28/2021  9:33 AM      Failed - Valid encounter within last 6 months    Recent Outpatient Visits           1 year ago Wound cellulitis   [facility], Henrine Screws T, NP   1 year ago Acute intractable tension-type headache   [facility] Eulogio Bear, NP   1 year ago Essential hypertension   [facility] Merrie Roof [place], [place]   1 year ago Elevated blood pressure reading   [facility], [place], [place]   4 years ago Acute midline low back pain without sciatica   [facility] Kathrine Haddock, NP              Passed - Last BP in normal range    BP Readings from Last 1 Encounters:  02/25/20 103/75          Passed - Last Heart Rate in normal range    Pulse Readings from Last 1 Encounters:  02/25/20 95             Requested Prescriptions  Pending Prescriptions Disp Refills   labetalol (NORMODYNE) 100 MG tablet [Pharmacy Med Name: LABETALOL HCL 100 MG TABLET] 90 tablet 1    Sig: TAKE 1/2 TABLETS (50 MG TOTAL) BY MOUTH 2 (TWO) TIMES DAILY.     Cardiovascular:  Beta Blockers Failed - 05/28/2021  9:33 AM      Failed - Valid encounter within last 6 months    Recent Outpatient Visits           1 year ago Wound cellulitis   [facility], Henrine Screws T, NP   1 year ago Acute intractable  tension-type headache   [facility] Eulogio Bear, NP   1 year ago Essential hypertension   [facility] Merrie Roof [place], [place]   1 year ago Elevated blood pressure reading   [facility], [place], [place]   4 years ago Acute midline low back pain without sciatica   [facility] Kathrine Haddock, NP              Passed - Last BP in normal range    BP Readings from Last 1 Encounters:  02/25/20 103/75          Passed - Last Heart Rate in normal range    Pulse Readings from Last 1 Encounters:  02/25/20 95

## 2021-05-30 ENCOUNTER — Encounter: Payer: Self-pay | Admitting: Nurse Practitioner

## 2021-05-30 ENCOUNTER — Ambulatory Visit (INDEPENDENT_AMBULATORY_CARE_PROVIDER_SITE_OTHER): Payer: Self-pay | Admitting: Nurse Practitioner

## 2021-05-30 ENCOUNTER — Other Ambulatory Visit: Payer: Self-pay

## 2021-05-30 VITALS — BP 132/80 | HR 97 | Temp 98.6°F | Ht 65.0 in | Wt 140.0 lb

## 2021-05-30 DIAGNOSIS — I10 Essential (primary) hypertension: Secondary | ICD-10-CM

## 2021-05-30 DIAGNOSIS — J302 Other seasonal allergic rhinitis: Secondary | ICD-10-CM | POA: Insufficient documentation

## 2021-05-30 DIAGNOSIS — N760 Acute vaginitis: Secondary | ICD-10-CM

## 2021-05-30 DIAGNOSIS — B9689 Other specified bacterial agents as the cause of diseases classified elsewhere: Secondary | ICD-10-CM

## 2021-05-30 HISTORY — DX: Other seasonal allergic rhinitis: J30.2

## 2021-05-30 MED ORDER — METRONIDAZOLE 0.75 % VA GEL: VAGINAL | 5 refills | Status: DC

## 2021-05-30 MED ORDER — METRONIDAZOLE 500 MG PO TABS: 500.0000 mg | ORAL_TABLET | Freq: Two times a day (BID) | ORAL | 0 refills | Status: AC

## 2021-05-30 MED ORDER — LABETALOL HCL 100 MG PO TABS: ORAL_TABLET | ORAL | 4 refills | Status: DC

## 2021-05-30 NOTE — Progress Notes (Signed)
BP 132/80    Pulse 97    Temp 98.6 F (37 C) (Oral)    Ht 5\' 5"  (1.651 m)    Wt 140 lb (63.5 kg)    SpO2 98%    BMI 23.30 kg/m    Subjective:    Patient ID: Janet Villanueva, female    DOB: Sep 21, 1995, 26 y.o.   MRN: [id number]  HPI: Janet Villanueva is a 26 y.o. female  Chief Complaint  Patient presents with   Hypertension    Patient is here for her blood pressure. Patient states she as diagnosed with Hypertension. Patient states she is out of her medication and was told to follow up as she ran out last week. Patient states since then she has noticed some dizziness and headaches and wasn't sure if that was related to her being out of her medication.    VAGINAL DISCHARGE Recurrent BV infections -- reports she has one at present.   Duration: months Discharge description: mucous  Pruritus: no Dysuria: no Malodorous: yes Urinary frequency: no Fevers: no Abdominal pain: no  Sexual activity: not sexually active History of sexually transmitted diseases: no Recent antibiotic use: no Context: recurrent BV  Treatments attempted:  Flagyl    HYPERTENSION Continues on Labetalol.   Hypertension status: controlled  Satisfied with current treatment? yes Duration of hypertension: chronic BP monitoring frequency:  daily BP range: 120/80 range with medication BP medication side effects:  no Medication compliance: good compliance Previous BP meds: none Aspirin: no Recurrent headaches: recently without medication Visual changes: no Palpitations: no Dyspnea: no Chest pain: no Lower extremity edema: no Dizzy/lightheaded:  recently without medication on board    Depression screen [facility] 2/9 05/30/2021 10/31/2019  Decreased Interest 0 0  Down, Depressed, Hopeless 0 0  PHQ - 2 Score 0 0  Altered sleeping 0 0  Tired, decreased energy 0 0  Change in appetite 0 1  Feeling bad or failure about yourself  0 0  Trouble concentrating 0 0  Moving slowly or fidgety/restless 0 0   Suicidal thoughts 0 0  PHQ-9 Score 0 1  Difficult doing work/chores Not difficult at all -     Relevant past medical, surgical, family and social history reviewed and updated as indicated. Interim medical history since our last visit reviewed. Allergies and medications reviewed and updated.  Review of Systems  Constitutional:  Negative for activity change, appetite change, diaphoresis, fatigue and fever.  Respiratory:  Negative for cough, chest tightness and shortness of breath.   Cardiovascular:  Negative for chest pain, palpitations and leg swelling.  Gastrointestinal: Negative.   Endocrine: Negative for cold intolerance, heat intolerance, polydipsia, polyphagia and polyuria.  Neurological:  Negative for dizziness, syncope, weakness, light-headedness, numbness and headaches.  Psychiatric/Behavioral: Negative.     Per HPI unless specifically indicated above     Objective:    BP 132/80    Pulse 97    Temp 98.6 F (37 C) (Oral)    Ht 5\' 5"  (1.651 m)    Wt 140 lb (63.5 kg)    SpO2 98%    BMI 23.30 kg/m   Wt Readings from Last 3 Encounters:  05/30/21 140 lb (63.5 kg)  02/25/20 135 lb (61.2 kg)  12/12/19 133 lb (60.3 kg)    Physical Exam Vitals and nursing note reviewed.  Constitutional:      General: She is awake. She is not in acute distress.    Appearance: She is well-developed and well-groomed. She is  not ill-appearing or toxic-appearing.  HENT:     Head: Normocephalic.     Right Ear: Hearing normal.     Left Ear: Hearing normal.  Eyes:     General: Lids are normal.        Right eye: No discharge.        Left eye: No discharge.     Conjunctiva/sclera: Conjunctivae normal.     Pupils: Pupils are equal, round, and reactive to light.  Neck:     Thyroid: No thyromegaly.     Vascular: No carotid bruit or JVD.  Cardiovascular:     Rate and Rhythm: Normal rate and regular rhythm.     Heart sounds: Normal heart sounds. No murmur heard.   No gallop.  Pulmonary:      Effort: Pulmonary effort is normal. No accessory muscle usage or respiratory distress.     Breath sounds: Normal breath sounds.  Abdominal:     General: Bowel sounds are normal.     Palpations: Abdomen is soft.  Musculoskeletal:     Cervical back: Normal range of motion and neck supple.     Right lower leg: No edema.     Left lower leg: No edema.  Lymphadenopathy:     Cervical: No cervical adenopathy.  Skin:    General: Skin is warm and dry.  Neurological:     Mental Status: She is alert and oriented to person, place, and time.  Psychiatric:        Attention and Perception: Attention normal.        Mood and Affect: Mood normal.        Behavior: Behavior normal. Behavior is cooperative.        Thought Content: Thought content normal.        Judgment: Judgment normal.   Results for orders placed or performed during the hospital encounter of 02/25/20  CBC  Result Value Ref Range   WBC 6.8 4.0 - 10.5 K/uL   RBC 4.56 3.87 - 5.11 MIL/uL   Hemoglobin 13.2 12.0 - 15.0 g/dL   HCT 89.7 84.7 - 84.1 %   MCV 89.0 80.0 - 100.0 fL   MCH 28.9 26.0 - 34.0 pg   MCHC 32.5 30.0 - 36.0 g/dL   RDW 28.2 08.1 - 38.8 %   Platelets 312 150 - 400 K/uL   nRBC 0.0 0.0 - 0.2 %  Basic metabolic panel  Result Value Ref Range   Sodium 140 135 - 145 mmol/L   Potassium 4.0 3.5 - 5.1 mmol/L   Chloride 108 98 - 111 mmol/L   CO2 24 22 - 32 mmol/L   Glucose, Bld 92 70 - 99 mg/dL   BUN 16 6 - 20 mg/dL   Creatinine, Ser 7.19 (H) 0.44 - 1.00 mg/dL   Calcium 9.5 8.9 - 59.7 mg/dL   GFR, Estimated >47 >18 mL/min   Anion gap 8 5 - 15  Ethanol  Result Value Ref Range   Alcohol, Ethyl (B) 231 (H) <10 mg/dL      Assessment & Plan:   Problem List Items Addressed This Visit       Cardiovascular and Mediastinum   Essential hypertension - Primary    Chronic, stable with BP at goal today.  She is child bearing age and plans to have children in future.  At this time continue Labetalol as ordered, safer for  pregnancy.  Recommend she monitor BP at least a few mornings a week at home and document.  DASH diet at home.  Continue current medication regimen and adjust as needed.  Labs outpatient ordered.         Relevant Medications   labetalol (NORMODYNE) 100 MG tablet   Other Relevant Orders   Basic Metabolic Panel (BMET)     Genitourinary   BV (bacterial vaginosis)    Recurrent infections reported -- is self pay.  At this time treat with oral Flagyl for 7 days and then transition to twice weekly gel vaginally for 4 months.  She is aware to return if ongoing.      Relevant Medications   metroNIDAZOLE (FLAGYL) 500 MG tablet     Follow up plan: Return in about 1 year (around 05/30/2022) for HTN CHECK AND REFILLS.

## 2021-05-30 NOTE — Assessment & Plan Note (Signed)
Chronic, stable with BP at goal today.  She is child bearing age and plans to have children in future.  At this time continue Labetalol as ordered, safer for pregnancy.  Recommend she monitor BP at least a few mornings a week at home and document.  DASH diet at home.  Continue current medication regimen and adjust as needed.  Labs outpatient ordered.

## 2021-05-30 NOTE — Assessment & Plan Note (Signed)
Recurrent infections reported -- is self pay.  At this time treat with oral Flagyl for 7 days and then transition to twice weekly gel vaginally for 4 months.  She is aware to return if ongoing.

## 2021-05-30 NOTE — Patient Instructions (Signed)

## 2021-06-03 ENCOUNTER — Ambulatory Visit: Payer: Commercial Managed Care - PPO | Admitting: Gerontology

## 2021-08-14 ENCOUNTER — Ambulatory Visit: Payer: Self-pay | Admitting: Nurse Practitioner

## 2021-08-14 ENCOUNTER — Encounter: Payer: Self-pay | Admitting: Nurse Practitioner

## 2021-08-14 VITALS — BP 136/81 | HR 90 | Temp 98.3°F | Ht 65.0 in | Wt 144.8 lb

## 2021-08-14 DIAGNOSIS — B9789 Other viral agents as the cause of diseases classified elsewhere: Secondary | ICD-10-CM | POA: Insufficient documentation

## 2021-08-14 DIAGNOSIS — J028 Acute pharyngitis due to other specified organisms: Secondary | ICD-10-CM

## 2021-08-14 MED ORDER — LIDOCAINE VISCOUS HCL 2 % MT SOLN: 15.0000 mL | OROMUCOSAL | 0 refills | Status: DC | PRN

## 2021-08-14 NOTE — Patient Instructions (Signed)
Sore Throat When you have a sore throat, your throat may feel: Tender. Burning. Irritated. Scratchy. Painful when you swallow. Painful when you talk. Many things can cause a sore throat, such as: An infection. Allergies. Dry air. Smoke or pollution. Radiation treatment for cancer. Gastroesophageal reflux disease (GERD). A tumor. A sore throat can be the first sign of another sickness. It can happen with other problems, like: Coughing. Sneezing. Fever. Swelling of the glands in the neck. Most sore throats go away without treatment. Follow these instructions at home:     Medicines Take over-the-counter and prescription medicines only as told by your doctor. Children often get sore throats. Do not give your child aspirin. Use throat sprays to soothe your throat as told by your health care provider. Managing pain To help with pain: Sip warm liquids, such as broth, herbal tea, or warm water. Eat or drink cold or frozen liquids, such as frozen ice pops. Rinse your mouth (gargle) with a salt water mixture 3-4 times a day or as needed. To make salt water, dissolve -1 tsp (3-6 g) of salt in 1 cup (237 mL) of warm water. Do not swallow this mixture. Suck on hard candy or throat lozenges. Put a cool-mist humidifier in your bedroom at night. Sit in the bathroom with the door closed for 5-10 minutes while you run hot water in the shower. General instructions Do not smoke or use any products that contain nicotine or tobacco. If you need help quitting, ask your doctor. Get plenty of rest. Drink enough fluid to keep your pee (urine) pale yellow. Wash your hands often for at least 20 seconds with soap and water. If soap and water are not available, use hand sanitizer. Contact a doctor if: You have a fever for more than 2-3 days. You keep having symptoms for more than 2-3 days. Your throat does not get better in 7 days. You have a fever and your symptoms suddenly get worse. Your  child who is 3 months to 3 years old has a temperature of 102.2F (39C) or higher. Get help right away if: You have trouble breathing. You cannot swallow fluids, soft foods, or your spit. You have swelling in your throat or neck that gets worse. You feel like you may vomit (nauseous) and this feeling lasts a long time. You cannot stop vomiting. These symptoms may be an emergency. Get help right away. Call your local emergency services (911 in the [place] Do not wait to see if the symptoms will go away. Do not drive yourself to the hospital. Summary A sore throat is a painful, burning, irritated, or scratchy throat. Many things can cause a sore throat. Take over-the-counter medicines only as told by your doctor. Get plenty of rest. Drink enough fluid to keep your pee (urine) pale yellow. Contact a doctor if your symptoms get worse or your sore throat does not get better within 7 days. This information is not intended to replace advice given to you by your health care provider. Make sure you discuss any questions you have with your health care provider. Document Revised: 07/03/2020 Document Reviewed: 07/03/2020 Elsevier Patient Education  2023 [employer] Inc.  

## 2021-08-14 NOTE — Assessment & Plan Note (Signed)
Acute since Sunday after Covid vaccination, suspect more viral at this time.  Strep testing negative, will send for culture.  At this time will treat with viscous Lidocaine and recommend over the counter Claritin daily until symptoms improved.  Recommend: ?- Increased rest ?- Increasing Fluids ?- Acetaminophen / ibuprofen as needed for fever/pain.  ?- Salt water gargling, chloraseptic spray and throat lozenges ?- Mucinex.  ?Return to office if worsening or ongoing.  If after this upcoming Sunday, symptoms still persist, then will provide abx. ?

## 2021-08-14 NOTE — Progress Notes (Signed)
? ?BP 136/81   Pulse 90   Temp 98.3 ?F (36.8 ?C) (Oral)   Ht 5\' 5"  (1.651 m)   Wt 144 lb 12.8 oz (65.7 kg)   SpO2 97%   BMI 24.10 kg/m?   ? ?Subjective:  ? ? Patient ID: , female    DOB: 09/10/1995, 26 y.o.   MRN: [id number] ? ?HPI: ?Janet Villanueva is a 26 y.o. female ? ?Chief Complaint  ?Patient presents with  ? Sore Throat  ?  Patient states a couple of days ago, she noticed some pain with swallowing. Patient states she has been taking over the counter Mucinex and Motrin. Patient denies having fever. Patient states she received a COVID vaccine Friday. Patient states Saturday woke up with symptoms of chills and couldn't get warm, but Sunday she was fine and did not have any symptoms.   ? ?SORE THROAT ?Started after Covid booster on Sunday -- initially with sore throat and body aches.  On Sunday morning she felt better, but the sore throat stayed + noticed white patches.  Hurts most when swallowing.  She does vape daily.   ?Fever: no ?Cough: no ?Shortness of breath: no ?Wheezing: no ?Chest pain: no ?Chest tightness: no ?Chest congestion: no ?Nasal congestion: no ?Runny nose: no ?Post nasal drip: no ?Sneezing: no ?Sore throat: yes ?Swollen glands: no ?Sinus pressure: no ?Headache: yes ?Face pain: no ?Toothache: no ?Ear pain: none ?Ear pressure: none ?Eyes red/itching:no ?Eye drainage/crusting: no  ?Vomiting: no ?Rash: no ?Fatigue: no ?Sick contacts: yes was on a flight ?Strep contacts: no  ?Context: stable ?Recurrent sinusitis: no ?Relief with OTC cold/cough medications: a little ?Treatments attempted: Mucinex and Motrin   ? ?Relevant past medical, surgical, family and social history reviewed and updated as indicated. Interim medical history since our last visit reviewed. ?Allergies and medications reviewed and updated. ? ?Review of Systems  ?Constitutional:  Positive for fatigue. Negative for activity change, appetite change, chills and fever.  ?HENT:  Positive for sore throat.  Negative for congestion, ear discharge, ear pain, facial swelling, postnasal drip, rhinorrhea, sinus pressure, sinus pain, sneezing and voice change.   ?Eyes:  Negative for pain and visual disturbance.  ?Respiratory:  Negative for cough, chest tightness, shortness of breath and wheezing.   ?Cardiovascular:  Negative for chest pain, palpitations and leg swelling.  ?Neurological: Negative.   ?Psychiatric/Behavioral: Negative.    ? ?Per HPI unless specifically indicated above ? ?   ?Objective:  ?  ?BP 136/81   Pulse 90   Temp 98.3 ?F (36.8 ?C) (Oral)   Ht 5\' 5"  (1.651 m)   Wt 144 lb 12.8 oz (65.7 kg)   SpO2 97%   BMI 24.10 kg/m?   ?Wt Readings from Last 3 Encounters:  ?08/14/21 144 lb 12.8 oz (65.7 kg)  ?05/30/21 140 lb (63.5 kg)  ?02/25/20 135 lb (61.2 kg)  ?  ?Physical Exam ?Vitals and nursing note reviewed.  ?Constitutional:   ?   General: She is awake. She is not in acute distress. ?   Appearance: She is well-developed and well-groomed. She is not ill-appearing or toxic-appearing.  ?HENT:  ?   Head: Normocephalic.  ?   Right Ear: Hearing, tympanic membrane, ear canal and external ear normal.  ?   Left Ear: Hearing, tympanic membrane, ear canal and external ear normal.  ?   Nose: Nose normal.  ?   Right Sinus: No maxillary sinus tenderness or frontal sinus tenderness.  ?   Left Sinus:  No maxillary sinus tenderness or frontal sinus tenderness.  ?   Mouth/Throat:  ?   Mouth: Mucous membranes are moist.  ?   Pharynx: Posterior oropharyngeal erythema (very mild cobblestone appearance) present. No pharyngeal swelling or oropharyngeal exudate.  ?   Comments: Tonsils not present.  Small white patch to posterior pharynx right side. ?Eyes:  ?   General: Lids are normal.     ?   Right eye: No discharge.     ?   Left eye: No discharge.  ?   Conjunctiva/sclera: Conjunctivae normal.  ?   Pupils: Pupils are equal, round, and reactive to light.  ?Neck:  ?   Vascular: No carotid bruit.  ?Cardiovascular:  ?   Rate and Rhythm:  Normal rate and regular rhythm.  ?   Heart sounds: Normal heart sounds. No murmur heard. ?  No gallop.  ?Pulmonary:  ?   Effort: Pulmonary effort is normal. No accessory muscle usage or respiratory distress.  ?   Breath sounds: Normal breath sounds.  ?Abdominal:  ?   General: Bowel sounds are normal.  ?   Palpations: Abdomen is soft.  ?Musculoskeletal:  ?   Cervical back: Normal range of motion and neck supple.  ?   Right lower leg: No edema.  ?   Left lower leg: No edema.  ?Lymphadenopathy:  ?   Head:  ?   Right side of head: No submental, submandibular, tonsillar, preauricular or posterior auricular adenopathy.  ?   Left side of head: No submental, submandibular, tonsillar, preauricular or posterior auricular adenopathy.  ?   Cervical: No cervical adenopathy.  ?Skin: ?   General: Skin is warm and dry.  ?Neurological:  ?   Mental Status: She is alert and oriented to person, place, and time.  ?Psychiatric:     ?   Attention and Perception: Attention normal.     ?   Mood and Affect: Mood normal.     ?   Behavior: Behavior normal. Behavior is cooperative.     ?   Thought Content: Thought content normal.     ?   Judgment: Judgment normal.  ? ? ?Results for orders placed or performed in visit on 08/14/21  ?Rapid Strep screen([facility])  ? Specimen: Other  ? Other  ?Result Value Ref Range  ? Strep Gp A Ag, IA W/Reflex Negative Negative  ?Culture, Group A Strep  ? Other  ?Result Value Ref Range  ? Strep A Culture WILL FOLLOW   ? ?   ?Assessment & Plan:  ? ?Problem List Items Addressed This Visit   ? ?  ? Respiratory  ? Sore throat (viral) - Primary  ?  Acute since Sunday after Covid vaccination, suspect more viral at this time.  Strep testing negative, will send for culture.  At this time will treat with viscous Lidocaine and recommend over the counter Claritin daily until symptoms improved.  Recommend: ?- Increased rest ?- Increasing Fluids ?- Acetaminophen / ibuprofen as needed for fever/pain.  ?- Salt water  gargling, chloraseptic spray and throat lozenges ?- Mucinex.  ?Return to office if worsening or ongoing.  If after this upcoming Sunday, symptoms still persist, then will provide abx. ?  ?  ? Relevant Medications  ? lidocaine (XYLOCAINE) 2 % solution  ? Other Relevant Orders  ? Rapid Strep screen([facility]) (Completed)  ? Culture, Group A Strep (Completed)  ?  ? ?Follow up plan: ?Return if symptoms worsen or fail to improve. ? ? ? ? ? ?

## 2021-08-19 LAB — RAPID STREP SCREEN (MED CTR MEBANE ONLY): Strep Gp A Ag, IA W/Reflex: NEGATIVE

## 2021-08-19 LAB — CULTURE, GROUP A STREP: Strep A Culture: NEGATIVE

## 2022-05-31 NOTE — Patient Instructions (Signed)

## 2022-06-01 ENCOUNTER — Encounter: Payer: Self-pay | Admitting: Nurse Practitioner

## 2022-06-01 ENCOUNTER — Ambulatory Visit: Payer: PRIVATE HEALTH INSURANCE | Admitting: Nurse Practitioner

## 2022-06-01 VITALS — BP 148/98 | HR 90 | Temp 98.4°F | Ht 65.0 in | Wt 150.0 lb

## 2022-06-01 DIAGNOSIS — Z124 Encounter for screening for malignant neoplasm of cervix: Secondary | ICD-10-CM

## 2022-06-01 DIAGNOSIS — J301 Allergic rhinitis due to pollen: Secondary | ICD-10-CM

## 2022-06-01 DIAGNOSIS — I10 Essential (primary) hypertension: Secondary | ICD-10-CM

## 2022-06-01 MED ORDER — AMLODIPINE BESYLATE 5 MG PO TABS: 5.0000 mg | ORAL_TABLET | Freq: Every day | ORAL | 1 refills | Status: DC

## 2022-06-01 MED ORDER — LABETALOL HCL 100 MG PO TABS: ORAL_TABLET | ORAL | 1 refills | Status: DC

## 2022-06-01 NOTE — Assessment & Plan Note (Signed)
Chronic, ongoing with elevations noted today on initial and recheck.  At this time discussed with her trial of reduction to discontinuation of Labetalol, educated her that we can not cold [place] stop this but can reduce.  Has been on for many years and would benefit from different medication for better BP coverage.  Will start Amlodipine 5 MG daily, educated her on this and use in blood pressure control + side effects.  Reduce Labetalol to 50 MG daily.  LABS: CBC, CMP, TSH, lipid.  Plan on return in 4 weeks virtual, she will check BP readings at home -- will continue reduction to discontinuation of Labetalol if tolerating and increase Amlodipine further if needed.

## 2022-06-01 NOTE — Progress Notes (Signed)
BP (!) 148/98 (BP Location: Left Arm, Patient Position: Sitting, Cuff Size: Normal)   Pulse 90   Temp 98.4 F (36.9 C) (Oral)   Ht 5' 5"$  (1.651 m)   Wt 150 lb (68 kg)   BMI 24.96 kg/m    Subjective:    Patient ID: Janet Villanueva, female    DOB: 05-28-1995, 27 y.o.   MRN: [id number]  HPI: Janet Villanueva is a 27 y.o. female  Chief Complaint  Patient presents with   Blood Pressure Check   HYPERTENSION  Currently on Labetalol 50 MG BID, has been on this for many years.  Did not take medication this morning.  Was hung over a little yesterday, recent alcohol use at event.  Stopped vaping in December, nicotine, has gained weight since that time. Hypertension status: uncontrolled  Satisfied with current treatment? yes Duration of hypertension: chronic BP monitoring frequency:  not checking BP range:  BP medication side effects:  no Medication compliance: good compliance Aspirin: no Recurrent headaches: no Visual changes: no Palpitations: no Dyspnea: no Chest pain: no Lower extremity edema: no Dizzy/lightheaded: no   Relevant past medical, surgical, family and social history reviewed and updated as indicated. Interim medical history since our last visit reviewed. Allergies and medications reviewed and updated.  Review of Systems  Constitutional:  Negative for activity change, appetite change, diaphoresis, fatigue and fever.  Respiratory:  Negative for cough, chest tightness and shortness of breath.   Cardiovascular:  Negative for chest pain, palpitations and leg swelling.  Gastrointestinal: Negative.   Endocrine: Negative for cold intolerance, heat intolerance, polydipsia, polyphagia and polyuria.  Neurological:  Negative for dizziness, syncope, weakness, light-headedness, numbness and headaches.  Psychiatric/Behavioral: Negative.     Per HPI unless specifically indicated above     Objective:    BP (!) 148/98 (BP Location: Left Arm, Patient Position:  Sitting, Cuff Size: Normal)   Pulse 90   Temp 98.4 F (36.9 C) (Oral)   Ht 5' 5"$  (1.651 m)   Wt 150 lb (68 kg)   BMI 24.96 kg/m   Wt Readings from Last 3 Encounters:  06/01/22 150 lb (68 kg)  08/14/21 144 lb 12.8 oz (65.7 kg)  05/30/21 140 lb (63.5 kg)    Physical Exam Vitals and nursing note reviewed.  Constitutional:      General: She is awake. She is not in acute distress.    Appearance: She is well-developed and well-groomed. She is not ill-appearing or toxic-appearing.  HENT:     Head: Normocephalic.     Right Ear: Hearing normal.     Left Ear: Hearing normal.  Eyes:     General: Lids are normal.        Right eye: No discharge.        Left eye: No discharge.     Conjunctiva/sclera: Conjunctivae normal.     Pupils: Pupils are equal, round, and reactive to light.  Neck:     Thyroid: No thyromegaly.     Vascular: No carotid bruit or JVD.  Cardiovascular:     Rate and Rhythm: Normal rate and regular rhythm.     Heart sounds: Normal heart sounds. No murmur heard.    No gallop.  Pulmonary:     Effort: Pulmonary effort is normal. No accessory muscle usage or respiratory distress.     Breath sounds: Normal breath sounds.  Abdominal:     General: Bowel sounds are normal.     Palpations: Abdomen is soft.  Musculoskeletal:  Cervical back: Normal range of motion and neck supple.     Right lower leg: No edema.     Left lower leg: No edema.  Lymphadenopathy:     Cervical: No cervical adenopathy.  Skin:    General: Skin is warm and dry.  Neurological:     Mental Status: She is alert and oriented to person, place, and time.  Psychiatric:        Attention and Perception: Attention normal.        Mood and Affect: Mood normal.        Behavior: Behavior normal. Behavior is cooperative.        Thought Content: Thought content normal.        Judgment: Judgment normal.     Results for orders placed or performed in visit on 08/14/21  Rapid Strep screen([facility])    Specimen: Other   Other  Result Value Ref Range   Strep Gp A Ag, IA W/Reflex Negative Negative  Culture, Group A Strep   Other  Result Value Ref Range   Strep A Culture Negative       Assessment & Plan:   Problem List Items Addressed This Visit       Cardiovascular and Mediastinum   Essential hypertension - Primary    Chronic, ongoing with elevations noted today on initial and recheck.  At this time discussed with her trial of reduction to discontinuation of Labetalol, educated her that we can not cold [place] stop this but can reduce.  Has been on for many years and would benefit from different medication for better BP coverage.  Will start Amlodipine 5 MG daily, educated her on this and use in blood pressure control + side effects.  Reduce Labetalol to 50 MG daily.  LABS: CBC, CMP, TSH, lipid.  Plan on return in 4 weeks virtual, she will check BP readings at home -- will continue reduction to discontinuation of Labetalol if tolerating and increase Amlodipine further if needed.      Relevant Medications   labetalol (NORMODYNE) 100 MG tablet   amLODipine (NORVASC) 5 MG tablet   Other Relevant Orders   CBC with Differential/Platelet   Comprehensive metabolic panel   Lipid Panel w/o Chol/HDL Ratio   TSH     Follow up plan: Return in about 4 weeks (around 06/29/2022) for HTN -- virtual follow-up.

## 2022-06-02 LAB — LIPID PANEL W/O CHOL/HDL RATIO
Cholesterol, Total: 173 mg/dL (ref 100–199)
HDL: 82 mg/dL (ref >39)
LDL Chol Calc (NIH): 77 mg/dL (ref 0–99)
Triglycerides: 73 mg/dL (ref 0–149)
VLDL Cholesterol Cal: 14 mg/dL (ref 5–40)

## 2022-06-02 LAB — COMPREHENSIVE METABOLIC PANEL
ALT: 17 IU/L (ref 0–32)
AST: 22 IU/L (ref 0–40)
Albumin/Globulin Ratio: 1.3 (ref 1.2–2.2)
Albumin: 4.9 g/dL (ref 4.0–5.0)
Alkaline Phosphatase: 97 IU/L (ref 44–121)
BUN/Creatinine Ratio: 11 (ref 9–23)
BUN: 12 mg/dL (ref 6–20)
Bilirubin Total: 1.6 mg/dL — ABNORMAL HIGH (ref 0.0–1.2)
CO2: 22 mmol/L (ref 20–29)
Calcium: 10.3 mg/dL — ABNORMAL HIGH (ref 8.7–10.2)
Chloride: 101 mmol/L (ref 96–106)
Creatinine, Ser: 1.05 mg/dL — ABNORMAL HIGH (ref 0.57–1.00)
Globulin, Total: 3.7 g/dL (ref 1.5–4.5)
Glucose: 78 mg/dL (ref 70–99)
Potassium: 3.9 mmol/L (ref 3.5–5.2)
Sodium: 141 mmol/L (ref 134–144)
Total Protein: 8.6 g/dL — ABNORMAL HIGH (ref 6.0–8.5)
eGFR: 75 mL/min/{1.73_m2} (ref >59)

## 2022-06-02 LAB — CBC WITH DIFFERENTIAL/PLATELET
Basophils Absolute: 0 10*3/uL (ref 0.0–0.2)
Basos: 1 %
EOS (ABSOLUTE): 0.1 10*3/uL (ref 0.0–0.4)
Eos: 2 %
Hematocrit: 42.2 % (ref 34.0–46.6)
Hemoglobin: 13.7 g/dL (ref 11.1–15.9)
Immature Grans (Abs): 0 10*3/uL (ref 0.0–0.1)
Immature Granulocytes: 0 %
Lymphocytes Absolute: 2 10*3/uL (ref 0.7–3.1)
Lymphs: 32 %
MCH: 27.5 pg (ref 26.6–33.0)
MCHC: 32.5 g/dL (ref 31.5–35.7)
MCV: 85 fL (ref 79–97)
Monocytes Absolute: 0.6 10*3/uL (ref 0.1–0.9)
Monocytes: 10 %
Neutrophils Absolute: 3.5 10*3/uL (ref 1.4–7.0)
Neutrophils: 55 %
Platelets: 320 10*3/uL (ref 150–450)
RBC: 4.98 x10E6/uL (ref 3.77–5.28)
RDW: 15 % (ref 11.7–15.4)
WBC: 6.3 10*3/uL (ref 3.4–10.8)

## 2022-06-02 LAB — TSH: TSH: 1.38 u[IU]/mL (ref 0.450–4.500)

## 2022-06-02 NOTE — Progress Notes (Signed)
Contacted via [place] morning Raima, your labs have returned: - Kidney function, creatinine and eGFR, remains normal, as is liver function, AST and ALT.  - CBC shows no anemia or infection - Cholesterol levels look great - Thyroid lab is normal.  Overall very good labs.  Any questions? Keep being amazing!!  Thank you for allowing me to participate in your care.  I appreciate you. Kindest regards, Selma Rodelo

## 2022-06-29 ENCOUNTER — Telehealth: Payer: Self-pay | Admitting: Nurse Practitioner

## 2022-06-29 ENCOUNTER — Encounter: Payer: Self-pay | Admitting: Nurse Practitioner

## 2022-10-29 ENCOUNTER — Ambulatory Visit: Payer: Self-pay

## 2022-10-29 NOTE — Telephone Encounter (Signed)
  Chief Complaint: Medication question Symptoms: none Frequency: now Pertinent Negatives: Patient denies  Disposition: [] ED /[] Urgent Care (no appt availability in office) / [] Appointment(In office/virtual)/ []  [facility]/ [] Home Care/ [] Refused Recommended Disposition /[] [place] [facility]/ [x]  Follow-up with PCP Additional Notes: Pt is pregnant and wants to make sure that amlodipine is safe for her to take. Pt has first OB appt in August.  Please advise regarding medication in pregnancy.    Summary: amLODipine (NORVASC) 5 MG tablet and pregancy   Patient is taking amLODipine (NORVASC) 5 MG tablet, she just found out she is pregnant. Should she continue taking this medication. Please advise     Reason for Disposition  [1] Caller has URGENT medicine question about med that PCP or specialist prescribed AND [2] triager unable to answer question  Answer Assessment - Initial Assessment Questions 1. NAME of MEDICINE: "What medicine(s) are you calling about?"     Amlodipine 2. QUESTION: "What is your question?" (e.g., double dose of medicine, side effect)     Is Amlodipine safe during pregnancy 3. PRESCRIBER: "Who prescribed the medicine?" Reason: if prescribed by specialist, call should be referred to that group.     Jolene Cannady  5. PREGNANCY:  "Is there any chance that you are pregnant?" "When was your last menstrual period?"     YES  Protocols used: Medication Question Call-A-AH

## 2022-10-30 NOTE — Telephone Encounter (Signed)
Patient made aware of Provider's recommendations and verbalized understanding.   

## 2022-11-08 NOTE — Patient Instructions (Signed)

## 2022-11-11 ENCOUNTER — Encounter: Payer: Self-pay | Admitting: Nurse Practitioner

## 2022-11-11 ENCOUNTER — Telehealth (INDEPENDENT_AMBULATORY_CARE_PROVIDER_SITE_OTHER): Payer: BC Managed Care – PPO | Admitting: Nurse Practitioner

## 2022-11-11 VITALS — BP 136/95 | HR 91

## 2022-11-11 DIAGNOSIS — I10 Essential (primary) hypertension: Secondary | ICD-10-CM | POA: Diagnosis not present

## 2022-11-11 NOTE — Assessment & Plan Note (Signed)
Chronic, ongoing with current pregnancy of 7 weeks.  Sees OB upcoming.  At this time BP remains a little above goal and discussed with her risks of HTN during pregnancy, to include preeclampsia and eclampsia + at times reduction in fetal weight.  Recommend at this time to restart Labetalol, which is preferred agent in pregnancy.  Discussed that they may adjust regimen when she sees OB.  Focus on DASH diet and recommend she monitor BP at home a few days a week and document for OB visits.  Educated at length on HTN in pregnancy.

## 2022-11-11 NOTE — Progress Notes (Signed)
BP (!) 136/95   Pulse 91    Subjective:    Patient ID: Janet Villanueva, female    DOB: May 15, 1995, 27 y.o.   MRN: [id number]  HPI: Janet Villanueva is a 27 y.o. female  Chief Complaint  Patient presents with   Hypertension    Currently not taking any medication per PCP recommendations since patient is currently pregnant. Paitent has OB appt in August   This visit was completed via video visit through MyChart due to the restrictions of the COVID-19 pandemic. All issues as above were discussed and addressed. Physical exam was done as above through visual confirmation on video through MyChart. If it was felt that the patient should be evaluated in the office, they were directed there. The patient verbally consented to this visit. Location of the patient: home Location of the provider: work Those involved with this call:  Provider: Aura Dials, DNP CMA: Tristan Schroeder, CMA Front Desk/Registration: Kandice Hams  Time spent on call:  21 minutes with patient face to face via video conference. More than 50% of this time was spent in counseling and coordination of care. 15 minutes total spent in review of patient's record and preparation of their chart.  I verified patient identity using two factors (patient name and date of birth). Patient consents verbally to being seen via telemedicine visit today.    HYPERTENSION with Chronic Kidney Disease Currently is off medications due to pregnancy -- stopped taking medications last month. She has initial OB visit on August 13th.  This is her first pregnancy -- is currently 7 weeks.   Hypertension status: stable  Satisfied with current treatment? yes Duration of hypertension: chronic BP monitoring frequency:  not checking BP range:  BP medication side effects:  no Medication compliance: fair compliance Previous BP meds: Amlodipine and Labetalol Aspirin: no Recurrent headaches: no Visual changes: no Palpitations: no Dyspnea:  no Chest pain: no Lower extremity edema: no Dizzy/lightheaded: no  Relevant past medical, surgical, family and social history reviewed and updated as indicated. Interim medical history since our last visit reviewed. Allergies and medications reviewed and updated.  Review of Systems  Constitutional:  Negative for activity change, appetite change, diaphoresis, fatigue and fever.  Respiratory:  Negative for cough, chest tightness and shortness of breath.   Cardiovascular:  Negative for chest pain, palpitations and leg swelling.  Neurological: Negative.   Psychiatric/Behavioral: Negative.     Per HPI unless specifically indicated above     Objective:    BP (!) 136/95   Pulse 91   Wt Readings from Last 3 Encounters:  06/01/22 150 lb (68 kg)  08/14/21 144 lb 12.8 oz (65.7 kg)  05/30/21 140 lb (63.5 kg)    Physical Exam Vitals and nursing note reviewed.  Constitutional:      General: She is awake. She is not in acute distress.    Appearance: She is well-developed and well-groomed. She is not ill-appearing.  HENT:     Head: Normocephalic.     Right Ear: Hearing normal.     Left Ear: Hearing normal.  Eyes:     General: Lids are normal.        Right eye: No discharge.        Left eye: No discharge.     Conjunctiva/sclera: Conjunctivae normal.  Pulmonary:     Effort: Pulmonary effort is normal. No accessory muscle usage or respiratory distress.  Musculoskeletal:     Cervical back: Normal range of motion.  Neurological:  Mental Status: She is alert and oriented to person, place, and time.  Psychiatric:        Attention and Perception: Attention normal.        Mood and Affect: Mood normal.        Behavior: Behavior normal. Behavior is cooperative.        Thought Content: Thought content normal.        Judgment: Judgment normal.    Results for orders placed or performed in visit on 06/01/22  CBC with Differential/Platelet  Result Value Ref Range   WBC 6.3 3.4 - 10.8  x10E3/uL   RBC 4.98 3.77 - 5.28 x10E6/uL   Hemoglobin 13.7 11.1 - 15.9 g/dL   Hematocrit 13.0 86.5 - 46.6 %   MCV 85 79 - 97 fL   MCH 27.5 26.6 - 33.0 pg   MCHC 32.5 31.5 - 35.7 g/dL   RDW 78.4 69.6 - 29.5 %   Platelets 320 150 - 450 x10E3/uL   Neutrophils 55 Not Estab. %   Lymphs 32 Not Estab. %   Monocytes 10 Not Estab. %   Eos 2 Not Estab. %   Basos 1 Not Estab. %   Neutrophils Absolute 3.5 1.4 - 7.0 x10E3/uL   Lymphocytes Absolute 2.0 0.7 - 3.1 x10E3/uL   Monocytes Absolute 0.6 0.1 - 0.9 x10E3/uL   EOS (ABSOLUTE) 0.1 0.0 - 0.4 x10E3/uL   Basophils Absolute 0.0 0.0 - 0.2 x10E3/uL   Immature Granulocytes 0 Not Estab. %   Immature Grans (Abs) 0.0 0.0 - 0.1 x10E3/uL  Comprehensive metabolic panel  Result Value Ref Range   Glucose 78 70 - 99 mg/dL   BUN 12 6 - 20 mg/dL   Creatinine, Ser 2.84 (H) 0.57 - 1.00 mg/dL   eGFR 75 >13 KG/MWN/0.27   BUN/Creatinine Ratio 11 9 - 23   Sodium 141 134 - 144 mmol/L   Potassium 3.9 3.5 - 5.2 mmol/L   Chloride 101 96 - 106 mmol/L   CO2 22 20 - 29 mmol/L   Calcium 10.3 (H) 8.7 - 10.2 mg/dL   Total Protein 8.6 (H) 6.0 - 8.5 g/dL   Albumin 4.9 4.0 - 5.0 g/dL   Globulin, Total 3.7 1.5 - 4.5 g/dL   Albumin/Globulin Ratio 1.3 1.2 - 2.2   Bilirubin Total 1.6 (H) 0.0 - 1.2 mg/dL   Alkaline Phosphatase 97 44 - 121 IU/L   AST 22 0 - 40 IU/L   ALT 17 0 - 32 IU/L  Lipid Panel w/o Chol/HDL Ratio  Result Value Ref Range   Cholesterol, Total 173 100 - 199 mg/dL   Triglycerides 73 0 - 149 mg/dL   HDL 82 >25 mg/dL   VLDL Cholesterol Cal 14 5 - 40 mg/dL   LDL Chol Calc (NIH) 77 0 - 99 mg/dL  TSH  Result Value Ref Range   TSH 1.380 0.450 - 4.500 uIU/mL      Assessment & Plan:   Problem List Items Addressed This Visit       Cardiovascular and Mediastinum   Essential hypertension - Primary    Chronic, ongoing with current pregnancy of 7 weeks.  Sees OB upcoming.  At this time BP remains a little above goal and discussed with her risks of HTN  during pregnancy, to include preeclampsia and eclampsia + at times reduction in fetal weight.  Recommend at this time to restart Labetalol, which is preferred agent in pregnancy.  Discussed that they may adjust regimen when she sees OB.  Focus on DASH diet and recommend she monitor BP at home a few days a week and document for OB visits.  Educated at length on HTN in pregnancy.       I discussed the assessment and treatment plan with the patient. The patient was provided an opportunity to ask questions and all were answered. The patient agreed with the plan and demonstrated an understanding of the instructions.   The patient was advised to call back or seek an in-person evaluation if the symptoms worsen or if the condition fails to improve as anticipated.   I provided 21+ minutes of time during this encounter.    Follow up plan: Return if symptoms worsen or fail to improve.

## 2022-11-25 ENCOUNTER — Other Ambulatory Visit: Payer: Self-pay | Admitting: Nurse Practitioner

## 2022-11-26 NOTE — Telephone Encounter (Signed)
Requested Prescriptions  Pending Prescriptions Disp Refills   labetalol (NORMODYNE) 100 MG tablet [Pharmacy Med Name: LABETALOL HCL 100 MG TABLET] 90 tablet 1    Sig: TAKE ONE TABLET (50 MG) ONCE DAILY BY MOUTH.     Cardiovascular:  Beta Blockers Failed - 11/25/2022  2:12 AM      Failed - Last BP in normal range    BP Readings from Last 1 Encounters:  11/11/22 (!) 136/95         Passed - Last Heart Rate in normal range    Pulse Readings from Last 1 Encounters:  11/11/22 91         Passed - Valid encounter within last 6 months    Recent Outpatient Visits           2 weeks ago Essential hypertension   [place] [facility] [place], Hart T, NP   5 months ago Essential hypertension   [place] [facility] [place], Corrie Dandy T, NP   1 year ago Sore throat (viral)   [place] [facility] [place], Dorie Rank, NP   1 year ago Essential hypertension   [place] [facility] [place], [place] T, NP   2 years ago Wound cellulitis   [place] [facility] [place], [place] T, NP              Refused Prescriptions Disp Refills   amLODipine (NORVASC) 5 MG tablet [Pharmacy Med Name: AMLODIPINE BESYLATE 5 MG TAB] 90 tablet 1    Sig: TAKE 1 TABLET (5 MG TOTAL) BY MOUTH DAILY.     Cardiovascular: Calcium Channel Blockers 2 Failed - 11/25/2022  2:12 AM      Failed - Last BP in normal range    BP Readings from Last 1 Encounters:  11/11/22 (!) 136/95         Passed - Last Heart Rate in normal range    Pulse Readings from Last 1 Encounters:  11/11/22 91         Passed - Valid encounter within last 6 months    Recent Outpatient Visits           2 weeks ago Essential hypertension   [place] [facility] [place], [place] T, NP   5 months ago Essential hypertension   [place] [facility] [place], Corrie Dandy T, NP   1 year ago Sore throat (viral)   [place] [facility] [place],  Dorie Rank, NP   1 year ago Essential hypertension   [place] [facility] [place], Corrie Dandy T, NP   2 years ago Wound cellulitis    [facility] [place], Dorie Rank, NP

## 2022-11-26 NOTE — Telephone Encounter (Signed)
Requested medications are due for refill today.  yes  Requested medications are on the active medications list.  yes  Last refill. 06/01/2022 #90 1 rf  Future visit scheduled.   no  Notes to clinic.  Rx is for 100mg , Sig states:Take 1 tablet(50mg ). Please review rx.  LABETALOL HCL 100 MG TABLET        Will file in chart as: labetalol (NORMODYNE) 100 MG tablet   Sig: TAKE ONE TABLET (50 MG) ONCE DAILY BY MOUTH.      Requested Prescriptions  Pending Prescriptions Disp Refills   labetalol (NORMODYNE) 100 MG tablet [Pharmacy Med Name: LABETALOL HCL 100 MG TABLET] 90 tablet 1    Sig: TAKE ONE TABLET (50 MG) ONCE DAILY BY MOUTH.     Cardiovascular:  Beta Blockers Failed - 11/25/2022  2:12 AM      Failed - Last BP in normal range    BP Readings from Last 1 Encounters:  11/11/22 (!) 136/95         Passed - Last Heart Rate in normal range    Pulse Readings from Last 1 Encounters:  11/11/22 91         Passed - Valid encounter within last 6 months    Recent Outpatient Visits           2 weeks ago Essential hypertension   [place] [facility] [place], [place] T, NP   5 months ago Essential hypertension   [place] [facility] [place], Corrie Dandy T, NP   1 year ago Sore throat (viral)   [place] [facility] [place], Dorie Rank, NP   1 year ago Essential hypertension   [place] [facility] [place], [place] T, NP   2 years ago Wound cellulitis   [place] [facility] [place], [place] T, NP              Refused Prescriptions Disp Refills   amLODipine (NORVASC) 5 MG tablet [Pharmacy Med Name: AMLODIPINE BESYLATE 5 MG TAB] 90 tablet 1    Sig: TAKE 1 TABLET (5 MG TOTAL) BY MOUTH DAILY.     Cardiovascular: Calcium Channel Blockers 2 Failed - 11/25/2022  2:12 AM      Failed - Last BP in normal range    BP Readings from Last 1 Encounters:  11/11/22 (!) 136/95         Passed - Last Heart Rate in normal range     Pulse Readings from Last 1 Encounters:  11/11/22 91         Passed - Valid encounter within last 6 months    Recent Outpatient Visits           2 weeks ago Essential hypertension   [place] [facility] [place], [place] T, NP   5 months ago Essential hypertension   [place] [facility] [place], Corrie Dandy T, NP   1 year ago Sore throat (viral)   [place] [facility] [place], Dorie Rank, NP   1 year ago Essential hypertension   [place] [facility] [place], Corrie Dandy T, NP   2 years ago Wound cellulitis   [place] [facility] [place], Dorie Rank, NP

## 2022-12-01 ENCOUNTER — Ambulatory Visit: Payer: BC Managed Care – PPO | Admitting: *Deleted

## 2022-12-01 ENCOUNTER — Other Ambulatory Visit (INDEPENDENT_AMBULATORY_CARE_PROVIDER_SITE_OTHER): Payer: BC Managed Care – PPO

## 2022-12-01 VITALS — BP 134/88 | HR 88 | Wt 149.0 lb

## 2022-12-01 DIAGNOSIS — Z3A09 9 weeks gestation of pregnancy: Secondary | ICD-10-CM | POA: Diagnosis not present

## 2022-12-01 DIAGNOSIS — O3680X0 Pregnancy with inconclusive fetal viability, not applicable or unspecified: Secondary | ICD-10-CM

## 2022-12-01 DIAGNOSIS — O10019 Pre-existing essential hypertension complicating pregnancy, unspecified trimester: Secondary | ICD-10-CM

## 2022-12-01 DIAGNOSIS — O0990 Supervision of high risk pregnancy, unspecified, unspecified trimester: Secondary | ICD-10-CM | POA: Insufficient documentation

## 2022-12-01 DIAGNOSIS — O0991 Supervision of high risk pregnancy, unspecified, first trimester: Secondary | ICD-10-CM

## 2022-12-01 DIAGNOSIS — Z3481 Encounter for supervision of other normal pregnancy, first trimester: Secondary | ICD-10-CM | POA: Diagnosis not present

## 2022-12-01 MED ORDER — LABETALOL HCL 200 MG PO TABS: 200.0000 mg | ORAL_TABLET | Freq: Two times a day (BID) | ORAL | 3 refills | Status: DC

## 2022-12-01 NOTE — Progress Notes (Signed)
New OB Intake  I explained I am completing New OB Intake today. We discussed EDD of 07/01/2023, by Last Menstrual Period. Pt is G2P0010. I reviewed her allergies, medications and Medical/Surgical/OB history.    Patient Active Problem List   Diagnosis Date Noted   Supervision of high risk pregnancy, antepartum 12/01/2022   Essential hypertension in pregnancy 11/15/2019    Concerns addressed today  Patient informed that the ultrasound is considered a limited obstetric ultrasound and is not intended to be a complete ultrasound exam.  Patient also informed that the ultrasound is not being completed with the intent of assessing for fetal or placental anomalies or any pelvic abnormalities. Explained that the purpose of today's ultrasound is to assess for viability.  Patient acknowledges the purpose of the exam and the limitations of the study.     Delivery Plans Plans to deliver at [facility]. Discussed the nature of our practice with multiple providers including residents and students. Due to the size of the practice, the delivering provider may not be the same as those providing prenatal care.   MyChart/Babyscripts MyChart access verified. I explained pt will have some visits in office and some virtually. Babyscripts app discussed and ordered.   Blood Pressure Cuff Blood pressure cuff discussed and pt has one at home Discussed to be used for virtual visits and or if needed BP checks weekly.  Anatomy US Explained first scheduled [place] will be around 19 weeks.   Last Pap Diagnosis  Date Value Ref Range Status  06/28/2018   Final   NEGATIVE FOR INTRAEPITHELIAL LESIONS OR MALIGNANCY.    Discussed blood pressures and medication with Dr Para March, dose changed to Labetalol 200mg  BID. Pt aware and will let us know if she has an issues.  First visit review I reviewed new OB appt with patient. Explained pt will be seen by Dr Shawnie Pons at first visit. Discussed Avelina Laine genetic screening with patient will  collect at [facility] if patient desires. Routine prenatal labs collected today.    Scheryl Marten, RN 12/01/2022  1:52 PM

## 2022-12-02 ENCOUNTER — Encounter: Payer: Self-pay | Admitting: Family Medicine

## 2022-12-02 DIAGNOSIS — O1210 Gestational proteinuria, unspecified trimester: Secondary | ICD-10-CM | POA: Insufficient documentation

## 2022-12-10 ENCOUNTER — Emergency Department: Payer: BC Managed Care – PPO

## 2022-12-10 ENCOUNTER — Other Ambulatory Visit: Payer: Self-pay

## 2022-12-10 ENCOUNTER — Encounter: Payer: Self-pay | Admitting: Emergency Medicine

## 2022-12-10 DIAGNOSIS — O161 Unspecified maternal hypertension, first trimester: Secondary | ICD-10-CM | POA: Diagnosis not present

## 2022-12-10 DIAGNOSIS — N83512 Torsion of left ovary and ovarian pedicle: Secondary | ICD-10-CM | POA: Insufficient documentation

## 2022-12-10 DIAGNOSIS — O26891 Other specified pregnancy related conditions, first trimester: Secondary | ICD-10-CM | POA: Diagnosis not present

## 2022-12-10 DIAGNOSIS — Z9089 Acquired absence of other organs: Secondary | ICD-10-CM | POA: Diagnosis not present

## 2022-12-10 DIAGNOSIS — Z3A11 11 weeks gestation of pregnancy: Secondary | ICD-10-CM | POA: Insufficient documentation

## 2022-12-10 DIAGNOSIS — Z79899 Other long term (current) drug therapy: Secondary | ICD-10-CM | POA: Insufficient documentation

## 2022-12-10 LAB — CBC WITH DIFFERENTIAL/PLATELET
Abs Immature Granulocytes: 0.03 10*3/uL (ref 0.00–0.07)
Basophils Absolute: 0 10*3/uL (ref 0.0–0.1)
Basophils Relative: 0 %
Eosinophils Absolute: 0.1 10*3/uL (ref 0.0–0.5)
Eosinophils Relative: 2 %
HCT: 37.1 % (ref 36.0–46.0)
Hemoglobin: 12.3 g/dL (ref 12.0–15.0)
Immature Granulocytes: 0 %
Lymphocytes Relative: 33 %
Lymphs Abs: 2.4 10*3/uL (ref 0.7–4.0)
MCH: 28.9 pg (ref 26.0–34.0)
MCHC: 33.2 g/dL (ref 30.0–36.0)
MCV: 87.1 fL (ref 80.0–100.0)
Monocytes Absolute: 0.7 10*3/uL (ref 0.1–1.0)
Monocytes Relative: 10 %
Neutro Abs: 3.9 10*3/uL (ref 1.7–7.7)
Neutrophils Relative %: 55 %
Platelets: 300 10*3/uL (ref 150–400)
RBC: 4.26 MIL/uL (ref 3.87–5.11)
RDW: 14.3 % (ref 11.5–15.5)
WBC: 7.2 10*3/uL (ref 4.0–10.5)
nRBC: 0 % (ref 0.0–0.2)

## 2022-12-10 LAB — COMPREHENSIVE METABOLIC PANEL
ALT: 13 U/L (ref 0–44)
AST: 18 U/L (ref 15–41)
Albumin: 3.8 g/dL (ref 3.5–5.0)
Alkaline Phosphatase: 43 U/L (ref 38–126)
Anion gap: 11 (ref 5–15)
BUN: 12 mg/dL (ref 6–20)
CO2: 22 mmol/L (ref 22–32)
Calcium: 9.9 mg/dL (ref 8.9–10.3)
Chloride: 103 mmol/L (ref 98–111)
Creatinine, Ser: 0.82 mg/dL (ref 0.44–1.00)
GFR, Estimated: >60 mL/min (ref >60)
Glucose, Bld: 112 mg/dL — ABNORMAL HIGH (ref 70–99)
Potassium: 3.8 mmol/L (ref 3.5–5.1)
Sodium: 136 mmol/L (ref 135–145)
Total Bilirubin: 0.9 mg/dL (ref 0.3–1.2)
Total Protein: 7.7 g/dL (ref 6.5–8.1)

## 2022-12-10 NOTE — ED Triage Notes (Signed)
Pt presents to triage via [facility] from home with lower abdominal pain since 2200. Pt is 11 weeks (G1) and has some pressure in her lower abdomen. Rates the pain 9/10. A&Ox4 at this time. Denies N/V/D, vaginal bleeding, CP or SOB.    VS with EMS   107 158/118 100%  112- CBG

## 2022-12-11 ENCOUNTER — Emergency Department: Payer: BC Managed Care – PPO

## 2022-12-11 ENCOUNTER — Other Ambulatory Visit: Payer: Self-pay

## 2022-12-11 ENCOUNTER — Encounter
Admission: EM | Disposition: A | Discharge status: Home / Self Care | Payer: Self-pay | Source: Home / Self Care | Attending: Emergency Medicine

## 2022-12-11 ENCOUNTER — Emergency Department: Payer: BC Managed Care – PPO | Admitting: Anesthesiology

## 2022-12-11 ENCOUNTER — Encounter: Payer: Self-pay | Admitting: Obstetrics and Gynecology

## 2022-12-11 ENCOUNTER — Ambulatory Visit
Admission: EM | Admit: 2022-12-11 | Discharge: 2022-12-11 | Disposition: A | Discharge status: Home / Self Care | Payer: BC Managed Care – PPO | Attending: Emergency Medicine | Admitting: Emergency Medicine

## 2022-12-11 DIAGNOSIS — N83512 Torsion of left ovary and ovarian pedicle: Secondary | ICD-10-CM

## 2022-12-11 HISTORY — PX: LAPAROSCOPIC OVARIAN CYSTECTOMY: SHX6248

## 2022-12-11 HISTORY — PX: LAPAROSCOPY: SHX197

## 2022-12-11 LAB — URINALYSIS, ROUTINE W REFLEX MICROSCOPIC
Bacteria, UA: NONE SEEN
Bilirubin Urine: NEGATIVE
Glucose, UA: NEGATIVE mg/dL
Hgb urine dipstick: NEGATIVE
Ketones, ur: 20 mg/dL — AB
Leukocytes,Ua: NEGATIVE
Nitrite: NEGATIVE
Protein, ur: 100 mg/dL — AB
Specific Gravity, Urine: 1.021 (ref 1.005–1.030)
pH: 6 (ref 5.0–8.0)

## 2022-12-11 LAB — TYPE AND SCREEN
ABO/RH(D): B POS
Antibody Screen: NEGATIVE

## 2022-12-11 LAB — WET PREP, GENITAL
Sperm: NONE SEEN
Trich, Wet Prep: NONE SEEN
WBC, Wet Prep HPF POC: <10 (ref <10)
Yeast Wet Prep HPF POC: NONE SEEN

## 2022-12-11 LAB — CHLAMYDIA/NGC RT PCR (ARMC ONLY)
Chlamydia Tr: NOT DETECTED
N gonorrhoeae: NOT DETECTED

## 2022-12-11 LAB — HCG, QUANTITATIVE, PREGNANCY: hCG, Beta Chain, Quant, S: 153386 m[IU]/mL — ABNORMAL HIGH (ref <5)

## 2022-12-11 SURGERY — LAPAROSCOPY, DIAGNOSTIC
Anesthesia: General | Site: Abdomen | Laterality: Left

## 2022-12-11 MED ORDER — ACETAMINOPHEN 10 MG/ML IV SOLN
INTRAVENOUS | Status: DC | PRN
  Administered 2022-12-11: 1000 mg via INTRAVENOUS

## 2022-12-11 MED ORDER — PROMETHAZINE HCL 25 MG/ML IJ SOLN: 6.2500 mg | INTRAMUSCULAR | Status: DC | PRN

## 2022-12-11 MED ORDER — BUPIVACAINE HCL (PF) 0.5 % IJ SOLN
INTRAMUSCULAR | Status: AC
  Filled 2022-12-11: qty 30

## 2022-12-11 MED ORDER — MORPHINE SULFATE (PF) 4 MG/ML IV SOLN
INTRAVENOUS | Status: AC
  Filled 2022-12-11: qty 1

## 2022-12-11 MED ORDER — 0.9 % SODIUM CHLORIDE (POUR BTL) OPTIME
TOPICAL | Status: DC | PRN
  Administered 2022-12-11: 500 mL

## 2022-12-11 MED ORDER — ACETAMINOPHEN 10 MG/ML IV SOLN
INTRAVENOUS | Status: AC
  Filled 2022-12-11: qty 100

## 2022-12-11 MED ORDER — BUPIVACAINE HCL 0.5 % IJ SOLN
INTRAMUSCULAR | Status: DC | PRN
  Administered 2022-12-11: 13 mL

## 2022-12-11 MED ORDER — MORPHINE SULFATE (PF) 4 MG/ML IV SOLN
4.0000 mg | Freq: Once | INTRAVENOUS | Status: AC
  Administered 2022-12-11: 4 mg via INTRAVENOUS

## 2022-12-11 MED ORDER — MORPHINE SULFATE (PF) 4 MG/ML IV SOLN
4.0000 mg | Freq: Once | INTRAVENOUS | Status: AC
  Administered 2022-12-11: 4 mg via INTRAVENOUS
  Filled 2022-12-11 (×2): qty 1

## 2022-12-11 MED ORDER — ACETAMINOPHEN 10 MG/ML IV SOLN
INTRAVENOUS | Status: AC
  Filled 2022-12-11: qty 200

## 2022-12-11 MED ORDER — LIDOCAINE HCL (CARDIAC) PF 100 MG/5ML IV SOSY
PREFILLED_SYRINGE | INTRAVENOUS | Status: DC | PRN
  Administered 2022-12-11: 80 mg via INTRAVENOUS

## 2022-12-11 MED ORDER — ATROPINE SULFATE 0.4 MG/ML IV SOLN
INTRAVENOUS | Status: DC | PRN
  Administered 2022-12-11: .8 mg via INTRAVENOUS

## 2022-12-11 MED ORDER — SODIUM CHLORIDE 0.9 % IV BOLUS (SEPSIS)
1000.0000 mL | Freq: Once | INTRAVENOUS | Status: AC
  Administered 2022-12-11: 1000 mL via INTRAVENOUS

## 2022-12-11 MED ORDER — DEXAMETHASONE SODIUM PHOSPHATE 10 MG/ML IJ SOLN
INTRAMUSCULAR | Status: DC | PRN
  Administered 2022-12-11: 5 mg via INTRAVENOUS

## 2022-12-11 MED ORDER — OXYCODONE HCL 5 MG/5ML PO SOLN: 5.0000 mg | Freq: Once | ORAL | Status: DC | PRN

## 2022-12-11 MED ORDER — FENTANYL CITRATE (PF) 100 MCG/2ML IJ SOLN
INTRAMUSCULAR | Status: DC | PRN
  Administered 2022-12-11 (×2): 50 ug via INTRAVENOUS

## 2022-12-11 MED ORDER — PROPOFOL 10 MG/ML IV BOLUS
INTRAVENOUS | Status: AC
  Filled 2022-12-11: qty 20

## 2022-12-11 MED ORDER — ACETAMINOPHEN EXTRA STRENGTH 500 MG PO TABS: 1000.0000 mg | ORAL_TABLET | Freq: Four times a day (QID) | ORAL | 0 refills | Status: AC

## 2022-12-11 MED ORDER — FENTANYL CITRATE (PF) 100 MCG/2ML IJ SOLN: 25.0000 ug | INTRAMUSCULAR | Status: DC | PRN

## 2022-12-11 MED ORDER — DOCUSATE SODIUM 100 MG PO CAPS: 100.0000 mg | ORAL_CAPSULE | Freq: Two times a day (BID) | ORAL | 0 refills | Status: DC

## 2022-12-11 MED ORDER — IBUPROFEN 800 MG PO TABS: 800.0000 mg | ORAL_TABLET | Freq: Three times a day (TID) | ORAL | 0 refills | Status: AC

## 2022-12-11 MED ORDER — PROPOFOL 10 MG/ML IV BOLUS
INTRAVENOUS | Status: DC | PRN
Start: 2022-12-11 — End: 2022-12-11
  Administered 2022-12-11: 130 mg via INTRAVENOUS

## 2022-12-11 MED ORDER — OXYCODONE HCL 5 MG PO TABS
5.0000 mg | ORAL_TABLET | ORAL | 0 refills | Status: AC | PRN
Start: 2022-12-11 — End: ?

## 2022-12-11 MED ORDER — POVIDONE-IODINE 10 % EX SWAB
2.0000 | Freq: Once | CUTANEOUS | Status: DC
  Filled 2022-12-11: qty 4

## 2022-12-11 MED ORDER — FENTANYL CITRATE (PF) 100 MCG/2ML IJ SOLN
INTRAMUSCULAR | Status: AC
  Filled 2022-12-11: qty 2

## 2022-12-11 MED ORDER — DROPERIDOL 2.5 MG/ML IJ SOLN: 0.6250 mg | Freq: Once | INTRAMUSCULAR | Status: DC | PRN

## 2022-12-11 MED ORDER — LACTATED RINGERS IV SOLN: INTRAVENOUS | Status: DC

## 2022-12-11 MED ORDER — ACETAMINOPHEN 500 MG PO TABS
1000.0000 mg | ORAL_TABLET | Freq: Once | ORAL | Status: AC
  Administered 2022-12-11: 1000 mg via ORAL
  Filled 2022-12-11: qty 2

## 2022-12-11 MED ORDER — NEOSTIGMINE METHYLSULFATE 10 MG/10ML IV SOLN
INTRAVENOUS | Status: DC | PRN
  Administered 2022-12-11: 5 mg via INTRAVENOUS

## 2022-12-11 MED ORDER — ACETAMINOPHEN 10 MG/ML IV SOLN: 1000.0000 mg | Freq: Once | INTRAVENOUS | Status: DC | PRN

## 2022-12-11 MED ORDER — ROCURONIUM BROMIDE 100 MG/10ML IV SOLN
INTRAVENOUS | Status: DC | PRN
  Administered 2022-12-11: 50 mg via INTRAVENOUS

## 2022-12-11 MED ORDER — KETOROLAC TROMETHAMINE 30 MG/ML IJ SOLN
INTRAMUSCULAR | Status: DC | PRN
  Administered 2022-12-11: 30 mg via INTRAVENOUS

## 2022-12-11 MED ORDER — OXYCODONE HCL 5 MG PO TABS: 5.0000 mg | ORAL_TABLET | Freq: Once | ORAL | Status: DC | PRN

## 2022-12-11 MED ORDER — SODIUM CHLORIDE 0.9 % IV SOLN: INTRAVENOUS | Status: DC

## 2022-12-11 MED ORDER — LACTATED RINGERS IV SOLN: INTRAVENOUS | Status: DC | PRN

## 2022-12-11 SURGICAL SUPPLY — 57 items
ADH SKN CLS APL DERMABOND .7 (GAUZE/BANDAGES/DRESSINGS) ×1
APL SRG 38 LTWT LNG FL B (MISCELLANEOUS) ×1
APPLICATOR ARISTA FLEXITIP XL (MISCELLANEOUS) ×1 IMPLANT
BAG DRN RND TRDRP ANRFLXCHMBR (UROLOGICAL SUPPLIES) ×1
BAG URINE DRAIN 2000ML AR STRL (UROLOGICAL SUPPLIES) ×1 IMPLANT
BLADE SURG SZ11 CARB STEEL (BLADE) ×1 IMPLANT
CATH FOLEY 2WAY 5CC 16FR (CATHETERS) ×1
CATH URTH 16FR FL 2W BLN LF (CATHETERS) ×1 IMPLANT
CORD MONOPOLAR M/FML 12FT (MISCELLANEOUS) ×1 IMPLANT
DERMABOND ADVANCED .7 DNX12 (GAUZE/BANDAGES/DRESSINGS) ×1 IMPLANT
DRAPE STERI POUCH LG 24X46 STR (DRAPES) IMPLANT
GAUZE 4X4 16PLY ~~LOC~~+RFID DBL (SPONGE) ×1 IMPLANT
GLOVE BIO SURGEON STRL SZ7 (GLOVE) ×2 IMPLANT
GLOVE INDICATOR 7.5 STRL GRN (GLOVE) ×1 IMPLANT
GLOVE SURG SYN 8.0 (GLOVE) ×1 IMPLANT
GLOVE SURG SYN 8.0 PF PI (GLOVE) ×1 IMPLANT
GOWN STRL REUS W/ TWL LRG LVL3 (GOWN DISPOSABLE) ×2 IMPLANT
GOWN STRL REUS W/ TWL XL LVL3 (GOWN DISPOSABLE) ×1 IMPLANT
GOWN STRL REUS W/TWL LRG LVL3 (GOWN DISPOSABLE) ×2
GOWN STRL REUS W/TWL XL LVL3 (GOWN DISPOSABLE) ×1
HEMOSTAT ARISTA ABSORB 3G PWDR (HEMOSTASIS) IMPLANT
IRRIGATION STRYKERFLOW (MISCELLANEOUS) IMPLANT
IRRIGATOR STRYKERFLOW (MISCELLANEOUS)
IV NS 1000ML (IV SOLUTION) ×1
IV NS 1000ML BAXH (IV SOLUTION) ×1 IMPLANT
KIT PINK PAD W/HEAD ARE REST (MISCELLANEOUS) ×1 IMPLANT
KIT PINK PAD W/HEAD ARM REST (MISCELLANEOUS) ×1 IMPLANT
KIT TURNOVER CYSTO (KITS) ×1 IMPLANT
L-HOOK LAP DISP 36CM (ELECTROSURGICAL)
LABEL OR SOLS (LABEL) ×1 IMPLANT
LHOOK LAP DISP 36CM (ELECTROSURGICAL) IMPLANT
LIGASURE VESSEL 5MM BLUNT TIP (ELECTROSURGICAL) IMPLANT
MANIFOLD NEPTUNE II (INSTRUMENTS) ×1 IMPLANT
MANIPULATOR UTERINE 4.5 ZUMI (MISCELLANEOUS) IMPLANT
NS IRRIG 500ML POUR BTL (IV SOLUTION) ×1 IMPLANT
PACK GYN LAPAROSCOPIC (MISCELLANEOUS) ×1 IMPLANT
PAD OB MATERNITY 4.3X12.25 (PERSONAL CARE ITEMS) ×1 IMPLANT
PAD PREP OB/GYN DISP 24X41 (PERSONAL CARE ITEMS) ×1 IMPLANT
SCISSORS METZENBAUM CVD 33 (INSTRUMENTS) IMPLANT
SCRUB CHG 4% DYNA-HEX 4OZ (MISCELLANEOUS) ×1 IMPLANT
SET TUBE SMOKE EVAC HIGH FLOW (TUBING) ×1 IMPLANT
SLEEVE Z-THREAD 5X100MM (TROCAR) ×1 IMPLANT
SOL PREP PVP 2OZ (MISCELLANEOUS) ×1
SOLUTION PREP PVP 2OZ (MISCELLANEOUS) ×1 IMPLANT
STRIP CLOSURE SKIN 1/4X4 (GAUZE/BANDAGES/DRESSINGS) IMPLANT
SUT MNCRL 4-0 (SUTURE) ×1
SUT MNCRL 4-0 27XMFL (SUTURE) ×1
SUT MNCRL AB 4-0 PS2 18 (SUTURE) ×1 IMPLANT
SUTURE MNCRL 4-0 27XMF (SUTURE) IMPLANT
SYR 50ML LL SCALE MARK (SYRINGE) IMPLANT
SYR 5ML LL (SYRINGE) IMPLANT
SYS BAG RETRIEVAL 10MM (BASKET)
SYSTEM BAG RETRIEVAL 10MM (BASKET) IMPLANT
TRAP FLUID SMOKE EVACUATOR (MISCELLANEOUS) ×1 IMPLANT
TROCAR Z-THREAD FIOS 5X100MM (TROCAR) ×1 IMPLANT
TUBING ART PRESS 48 MALE/FEM (TUBING) IMPLANT
WATER STERILE IRR 500ML POUR (IV SOLUTION) ×1 IMPLANT

## 2022-12-11 NOTE — Anesthesia Preprocedure Evaluation (Addendum)
Anesthesia Evaluation  Patient identified by MRN, date of birth, ID band Patient awake    Reviewed: Allergy & Precautions, H&P , NPO status , Patient's Chart, lab work & pertinent test results, reviewed documented beta blocker date and time   Airway Mallampati: I  TM Distance: >3 FB Neck ROM: full    Dental  (+) Chipped   Pulmonary neg pulmonary ROS, neg shortness of breath   Pulmonary exam normal        Cardiovascular hypertension, Pt. on home beta blockers and Pt. on medications Normal cardiovascular exam     Neuro/Psych negative neurological ROS  negative psych ROS   GI/Hepatic negative GI ROS, Neg liver ROS,,,  Endo/Other  negative endocrine ROS    Renal/GU      Musculoskeletal   Abdominal   Peds  Hematology negative hematology ROS (+)   Anesthesia Other Findings Left ovarian torsion [redacted] weeks Pregnant  FHR 152 BPM in PreOp  Past Medical History: No date: Anemia No date: Hypertension 05/20/2015: IBS (irritable bowel syndrome) 05/30/2021: Seasonal allergic rhinitis  Past Surgical History: No date: TONSILLECTOMY No date: TONSILLECTOMY  BMI    Body Mass Index: 24.13 kg/m      Reproductive/Obstetrics (+) Pregnancy                             Anesthesia Physical Anesthesia Plan  ASA: 2 and emergent  Anesthesia Plan: General ETT   Post-op Pain Management:    Induction: Intravenous and Rapid sequence  PONV Risk Score and Plan: 4 or greater and Ondansetron, Dexamethasone and Propofol infusion  Airway Management Planned: Oral ETT  Additional Equipment:   Intra-op Plan:   Post-operative Plan: Extubation in OR  Informed Consent: I have reviewed the patients History and Physical, chart, labs and discussed the procedure including the risks, benefits and alternatives for the proposed anesthesia with the patient or authorized representative who has indicated his/her  understanding and acceptance.     Dental Advisory Given  Plan Discussed with: CRNA and Surgeon  Anesthesia Plan Comments: (Plan for Pre and Post FHTs  Patient consented for risks of anesthesia including but not limited to:  - loss of fetus - adverse reactions to medications - damage to eyes, teeth, lips or other oral mucosa - nerve damage due to positioning  - sore throat or hoarseness - Damage to heart, brain, nerves, lungs, other parts of body or loss of life  Patient voiced understanding.)        Anesthesia Quick Evaluation

## 2022-12-11 NOTE — Op Note (Signed)
Evangeline Gula PROCEDURE DATE: 12/11/2022  INDICATIONS: Acute pelvic pain at 11wks  PREOPERATIVE DIAGNOSIS: Left ovarian torsion  POSTOPERATIVE DIAGNOSIS: Same PROCEDURE: Exam under anesthesia, diagnostic laparoscopy, detorsion of the left ovary SURGEON:  Dr. Christeen Douglas ASSISTANT: CST PA-S: Ayesha Mohair ANESTHESIOLOGIST: Yevette Edwards, MD Anesthesiologist: Yevette Edwards, MD; Piscitello, Cleda Mccreedy, MD CRNA: Cheral Bay, CRNA  INDICATIONS: 27 y.o. G2P0010 at 11wks with history of acute-onset pelvic pain desiring surgical evaluation.   Please see preoperative notes for further details.  Risks of surgery were discussed with the patient including but not limited to: bleeding which may require transfusion or reoperation; infection which may require antibiotics; injury to bowel, bladder, ureters or other surrounding organs; need for additional procedures including laparotomy; thromboembolic phenomenon, incisional problems and other postoperative/anesthesia complications. Written informed consent was obtained.    FINDINGS:  Gravid uterus, normal right ovary and fallopian tube. Left paratubal cyst with simple-appearing ovarian cyst on left side. One twist in the IP and UOL. No other abdominal/pelvic abnormality.  Normal upper abdomen.   ANESTHESIA:    General INTRAVENOUS FLUIDS: see anesthesia report ESTIMATED BLOOD LOSS: none ml URINE OUTPUT: 150 ml SPECIMENS: none COMPLICATIONS: None immediate  PROCEDURE IN DETAIL:  The patient had sequential compression devices applied to her lower extremities while in the preoperative area.  She was then taken to the operating room where general anesthesia was administered and was found to be adequate.  She was placed in the dorsal lithotomy position, and was prepped and draped in a sterile manner.  A Foley catheter was inserted into her bladder and attached to constant drainage.  After an adequate timeout was performed, attention was turned  to the abdomen where a palmer's point incision was made with the scalpel.  The Optiview 5-mm trocar and sleeve were then advanced without difficulty with the laparoscope under direct visualization into the abdomen.  The abdomen was then insufflated with carbon dioxide gas and adequate pneumoperitoneum was obtained.   A detailed survey of the patient's pelvis and abdomen revealed the findings as mentioned above. Two additional 5mm trocars were placed in the bilateral lower quadrants under direct visualization.  The pelvis was examined and the left ovary did appear to be edematous without any signs of necrosis or evidence of hematoma.  The left adnexa was detorsed and supported.  The corpus luteum cyst was left intact to support the pregnancy.  The operative site was surveyed, and it was found to be hemostatic.  No intraoperative injury to surrounding organs was noted.   Pictures were taken of the quadrants and pelvis. The abdomen was desufflated and all instruments were then removed from the patient's abdomen.  All incisions were closed with 4-0 Vicryl and Dermabond.   The patient tolerated the procedures well.  All instruments, needles, and sponge counts were correct x 2. The patient was taken to the recovery room in stable condition.

## 2022-12-11 NOTE — ED Provider Notes (Signed)
[facility] Provider Note    Event Date/Time   First MD Initiated Contact with Patient 12/11/22 [phone]     (approximate)   History   Abdominal Pain   HPI  Janet Villanueva is a 27 y.o. female G2P0010 who is 11 weeks and 1 day pregnant who presents to the emergency department with intermittent lower abdominal pain coming and going in waves that started tonight.  No fevers, dysuria, vaginal bleeding or discharge.  No vomiting or diarrhea.  No previous abdominal surgeries.  She has seen an OB/GYN for this pregnancy (Dr. Shawnie Pons in [place]).  Is on labetalol for hypertension.  History provided by patient, significant other.    Past Medical History:  Diagnosis Date   Anemia    Hypertension    IBS (irritable bowel syndrome) 05/20/2015   Seasonal allergic rhinitis 05/30/2021    Past Surgical History:  Procedure Laterality Date   TONSILLECTOMY     TONSILLECTOMY      MEDICATIONS:  Prior to Admission medications   Medication Sig Start Date End Date Taking? Authorizing Provider  labetalol (NORMODYNE) 100 MG tablet TAKE ONE TABLET (50 MG) ONCE DAILY BY MOUTH. 11/26/22   Cannady, Jolene T, NP  labetalol (NORMODYNE) 200 MG tablet Take 1 tablet (200 mg total) by mouth 2 (two) times daily. 12/01/22   Milas Hock, MD  Prenatal Vit-Fe Fumarate-FA (PRENATAL MULTIVITAMIN) TABS tablet Take 1 tablet by mouth daily at 12 noon.    [provider]    Physical Exam   Triage Vital Signs: ED Triage Vitals  Encounter Vitals Group     BP 12/10/22 2307 (!) 143/92     Systolic BP Percentile --      Diastolic BP Percentile --      Pulse Rate 12/10/22 2307 82     Resp 12/10/22 2307 18     Temp 12/10/22 2307 97.8 F (36.6 C)     Temp Source 12/10/22 2307 Oral     SpO2 12/10/22 2307 98 %     Weight 12/10/22 2305 145 lb (65.8 kg)     Height 12/10/22 2305 5\' 5"  (1.651 m)     Head Circumference --      Peak Flow --      Pain Score 12/10/22 2305 9     Pain  Loc --      Pain Education --      Exclude from Growth Chart --     Most recent vital signs: Vitals:   12/11/22 0326 12/11/22 0327  BP:    Pulse: 88 90  Resp:    Temp:    SpO2: 100% 100%    CONSTITUTIONAL: Alert, responds appropriately to questions.  Appears uncomfortable HEAD: Normocephalic, atraumatic EYES: Conjunctivae clear, pupils appear equal, sclera nonicteric ENT: normal nose; moist mucous membranes NECK: Supple, normal ROM CARD: RRR; S1 and S2 appreciated RESP: Normal chest excursion without splinting or tachypnea; breath sounds clear and equal bilaterally; no wheezes, no rhonchi, no rales, no hypoxia or respiratory distress, speaking full sentences ABD/GI: Non-distended; soft, tender throughout the lower abdomen without guarding or rebound, some tenderness at McBurney's point BACK: The back appears normal EXT: Normal ROM in all joints; no deformity noted, no edema SKIN: Normal color for age and race; warm; no rash on exposed skin NEURO: Moves all extremities equally, normal speech PSYCH: The patient's mood and manner are appropriate.   ED Results / Procedures / Treatments   LABS: (all labs ordered are listed, but  only abnormal results are displayed) Labs Reviewed  WET PREP, GENITAL - Abnormal; Notable for the following components:      Result Value   Clue Cells Wet Prep HPF POC PRESENT (*)    All other components within normal limits  COMPREHENSIVE METABOLIC PANEL - Abnormal; Notable for the following components:   Glucose, Bld 112 (*)    All other components within normal limits  HCG, QUANTITATIVE, PREGNANCY - Abnormal; Notable for the following components:   hCG, Beta Chain, Quant, S [id number] (*)    All other components within normal limits  URINALYSIS, ROUTINE W REFLEX MICROSCOPIC - Abnormal; Notable for the following components:   Color, Urine YELLOW (*)    APPearance HAZY (*)    Ketones, ur 20 (*)    Protein, ur 100 (*)    All other components within  normal limits  CHLAMYDIA/NGC RT PCR ([facility] ONLY)            CBC WITH DIFFERENTIAL/PLATELET  TYPE AND SCREEN     EKG:   RADIOLOGY: My personal review and interpretation of imaging: MRI concerning for left ovarian torsion.  Ultrasound shows intrauterine pregnancy with normal fetal heart tones.  I have personally reviewed all radiology reports.   MR ABDOMEN WO CONTRAST  Result Date: 12/11/2022 CLINICAL DATA:  Lower abdominal pain, [redacted] weeks pregnant EXAM: MRI ABDOMEN AND PELVIS WITHOUT CONTRAST TECHNIQUE: Multiplanar multisequence MR imaging of the abdomen and pelvis was performed. No intravenous contrast was administered. COMPARISON:  None Available. FINDINGS: COMBINED FINDINGS FOR BOTH MR ABDOMEN AND PELVIS Lower chest: No acute findings. Hepatobiliary: No mass or other parenchymal abnormality identified. Pancreas: No mass, inflammatory changes, or other parenchymal abnormality identified. Spleen:  Within normal limits in size and appearance. Adrenals/Urinary Tract: No masses identified. No evidence of hydronephrosis. Stomach/Bowel: Evaluation of the stomach, large bowel, and small bowel is slightly limited by respiratory motion artifact, however, the bowel is normal and there is no evidence of obstruction or focal inflammation. The appendix is well visualized anterior to the right psoas and is normal. Vascular/Lymphatic: No pathologically enlarged lymph nodes identified. No abdominal aortic aneurysm demonstrated. Reproductive: Gravid uterus identified. The left ovary is edematous, measuring 2.5 x 4.3 x 4.1 cm (volume = 23 cm^3) and demonstrating central afollicular stroma and peripherally displaced follicles which is asymmetric with the more normal appearing right ovary. There is mild free fluid within the pelvis and asymmetrically within the left adnexa surrounding the left ovary. Together, the findings are suspicious for changes of left ovarian torsion. Corpus luteum noted within the left ovary. The  right ovary is unremarkable. Other:  No abdominal wall hernia. Musculoskeletal: No suspicious bone lesions identified. IMPRESSION: 1. Edematous left ovary with peripherally displaced follicles and mild free fluid within the pelvis and left adnexa. Together, the findings are suspicious for changes of left ovarian torsion. This could be confirmed with transvaginal duplex sonography given the relatively low and posterior position of ovary. 2. Normal appendix. 3. Gravid uterus. Electronically Signed   By: Helyn Numbers M.D.   On: 12/11/2022 03:26   MR PELVIS WO CONTRAST  Result Date: 12/11/2022 CLINICAL DATA:  Lower abdominal pain, [redacted] weeks pregnant EXAM: MRI ABDOMEN AND PELVIS WITHOUT CONTRAST TECHNIQUE: Multiplanar multisequence MR imaging of the abdomen and pelvis was performed. No intravenous contrast was administered. COMPARISON:  None Available. FINDINGS: COMBINED FINDINGS FOR BOTH MR ABDOMEN AND PELVIS Lower chest: No acute findings. Hepatobiliary: No mass or other parenchymal abnormality identified. Pancreas: No mass, inflammatory changes, or  other parenchymal abnormality identified. Spleen:  Within normal limits in size and appearance. Adrenals/Urinary Tract: No masses identified. No evidence of hydronephrosis. Stomach/Bowel: Evaluation of the stomach, large bowel, and small bowel is slightly limited by respiratory motion artifact, however, the bowel is normal and there is no evidence of obstruction or focal inflammation. The appendix is well visualized anterior to the right psoas and is normal. Vascular/Lymphatic: No pathologically enlarged lymph nodes identified. No abdominal aortic aneurysm demonstrated. Reproductive: Gravid uterus identified. The left ovary is edematous, measuring 2.5 x 4.3 x 4.1 cm (volume = 23 cm^3) and demonstrating central afollicular stroma and peripherally displaced follicles which is asymmetric with the more normal appearing right ovary. There is mild free fluid within the  pelvis and asymmetrically within the left adnexa surrounding the left ovary. Together, the findings are suspicious for changes of left ovarian torsion. Corpus luteum noted within the left ovary. The right ovary is unremarkable. Other:  No abdominal wall hernia. Musculoskeletal: No suspicious bone lesions identified. IMPRESSION: 1. Edematous left ovary with peripherally displaced follicles and mild free fluid within the pelvis and left adnexa. Together, the findings are suspicious for changes of left ovarian torsion. This could be confirmed with transvaginal duplex sonography given the relatively low and posterior position of ovary. 2. Normal appendix. 3. Gravid uterus. Electronically Signed   By: Helyn Numbers M.D.   On: 12/11/2022 03:26   US OB Comp Less 14 Wks  Result Date: 12/11/2022 CLINICAL DATA:  Pelvic pain affecting pregnancy EXAM: OBSTETRIC <14 WK ULTRASOUND TECHNIQUE: Transabdominal ultrasound was performed for evaluation of the gestation as well as the maternal uterus and adnexal regions. COMPARISON:  None Available. FINDINGS: Intrauterine gestational sac: Single Yolk sac:  Visualized Embryo:  Visualized Cardiac Activity: Visualized Heart Rate: 164 bpm MSD:  46.4 mm   11 w   3 d CRL:     mm    w  d                  [place] EDC: 06/28/2023 Subchorionic hemorrhage:  Small subchorionic hemorrhage Maternal uterus/adnexae: No adnexal mass or free fluid. IMPRESSION: Eleven week 3 day intrauterine pregnancy. Fetal heart rate 164 beats per minute. Small subchorionic hemorrhage. Electronically Signed   By: Charlett Nose M.D.   On: 12/11/2022 00:26     PROCEDURES:  Critical Care performed: Yes, see critical care procedure note(s)   CRITICAL CARE Performed by: Baxter Hire Amarrah Meinhart   Total critical care time: 35 minutes  Critical care time was exclusive of separately billable procedures and treating other patients.  Critical care was necessary to treat or prevent imminent or life-threatening  deterioration.  Critical care was time spent personally by me on the following activities: development of treatment plan with patient and/or surrogate as well as nursing, discussions with consultants, evaluation of patient's response to treatment, examination of patient, obtaining history from patient or surrogate, ordering and performing treatments and interventions, ordering and review of laboratory studies, ordering and review of radiographic studies, pulse oximetry and re-evaluation of patient's condition.   Procedures    IMPRESSION / MDM / ASSESSMENT AND PLAN / ED COURSE  I reviewed the triage vital signs and the nursing notes.    Patient here with intermittent abdominal pain in the setting of pregnancy.   DIFFERENTIAL DIAGNOSIS (includes but not limited to):   UTI, less likely PID given patient is [redacted] weeks pregnant.  She has a known IUP, doubt ectopic.  Differential also includes appendicitis, round ligament pain, threatened  miscarriage, torsion.   Patient's presentation is most consistent with acute presentation with potential threat to life or bodily function.   PLAN: Labs obtained from triage.  No leukocytosis, normal renal function, LFTs.  hCG is 153,000.  OB ultrasound reviewed and interpreted by myself and the radiologist and shows single IUP with normal fetal heart tones.  Small subchorionic hemorrhage noted.  She is not having any vaginal bleeding.  Will give Tylenol, IV fluids for symptom management.  Given she is tender throughout the lower abdomen, will obtain MRI to evaluate for appendicitis.  Urine is pending.  Will also obtain pelvic cultures.   MEDICATIONS GIVEN IN ED: Medications  morphine (PF) 4 MG/ML injection 4 mg (has no administration in time range)  sodium chloride 0.9 % bolus 1,000 mL (0 mLs Intravenous Stopped 12/11/22 0337)  acetaminophen (TYLENOL) tablet 1,000 mg (1,000 mg Oral Given 12/11/22 0115)     ED COURSE: Patient reports pain has improved but  again still coming and going in waves.  MRI reviewed and interpreted by myself and the radiologist and is concerning for an a left ovarian torsion but no cyst seen.  No signs of appendicitis.  Will discuss with OB/GYN on-call.  Will keep patient n.p.o. now and give further pain medication.   CONSULTS:    3:38 AM  Spoke with Dr. Dalbert Garnet with OB/GYN.  She will see patient and take to the operating room emergently.  Patient and significant other updated with plan.  OUTSIDE RECORDS REVIEWED: Reviewed last OB/GYN note.       FINAL CLINICAL IMPRESSION(S) / ED DIAGNOSES   Final diagnoses:  Torsion of left ovary     Rx / DC Orders   ED Discharge Orders     None        Note:  This document was prepared using Dragon voice recognition software and may include unintentional dictation errors.   Iviona Hole, Layla Maw, DO 12/11/22 [phone]

## 2022-12-11 NOTE — Discharge Instructions (Addendum)
Laparoscopic Ovarian Surgery Discharge Instructions  For the next three days, take ibuprofen and acetaminophen on a schedule, every 8 hours. You can take them together or you can intersperse them, and take one every four hours. I also gave you gabapentin for nighttime, to help you sleep and also to control pain. Take gabapentin medicines at night for at least the next 3 nights. You also have a narcotic, oxycodone, to take as needed if the above medicines don't help.  Postop constipation is a major cause of pain. Stay well hydrated, walk as you tolerate, and take over the counter senna as well as stool softeners if you need them.   RISKS AND COMPLICATIONS  Infection. Bleeding. Injury to surrounding organs. Anesthetic side effects.   PROCEDURE  You may be given a medicine to help you relax (sedative) before the procedure. You will be given a medicine to make you sleep (general anesthetic) during the procedure. A tube will be put down your throat to help your breath while under general anesthesia. Several small cuts (incisions) are made in the lower abdominal area and one incision is made near the belly button. Your abdominal area will be inflated with a safe gas (carbon dioxide). This helps give the surgeon room to operate, visualize, and helps the surgeon avoid other organs. A thin, lighted tube (laparoscope) with a camera attached is inserted into your abdomen through the incision near the belly button. Other small instruments may also be inserted through other abdominal incisions. The ovary is located and are removed. After the ovary is removed, the gas is released from the abdomen. The incisions will be closed with stitches (sutures), and Dermabond. A bandage may be placed over the incisions.  AFTER THE PROCEDURE  You will also have some mild abdominal discomfort for 3-7 days. You will be given pain medicine to ease any discomfort. As long as there are no problems, you may be allowed to  go home. Someone will need to drive you home and be with you for at least 24 hours once home. You may have some mild discomfort in the throat. This is from the tube placed in your throat while you were sleeping. You may experience discomfort in the shoulder area from some trapped air between the liver and diaphragm. This sensation is normal and will slowly go away on its own.  HOME CARE INSTRUCTIONS  Take all medicines as directed. Only take over-the-counter or prescription medicines for pain, discomfort, or fever as directed by your caregiver. Resume daily activities as directed. Showers are preferred over baths for 2 weeks. You may resume sexual activities in 1 week or as you feel you would like to. Do not drive while taking narcotics.  SEEK MEDICAL CARE IF: . There is increasing abdominal pain. You feel lightheaded or faint. You have the chills. You have an oral temperature above 102 F (38.9 C). There is pus-like (purulent) drainage from any of the wounds. You are unable to pass gas or have a bowel movement. You feel sick to your stomach (nauseous) or throw up (vomit) and can't control it with your medicines.  MAKE SURE YOU:  Understand these instructions. Will watch your condition. Will get help right away if you are not doing well or get worse.  Exit[employer] Patient Information 2013 [employer]     AMBULATORY SURGERY  DISCHARGE INSTRUCTIONS   The drugs that you were given will stay in your system until tomorrow so for the next 24 hours you should not:    Drive an automobile Make any legal decisions Drink any alcoholic beverage   You may resume regular meals tomorrow.  Today it is better to start with liquids and gradually work up to solid foods.  You may eat anything you prefer, but it is better to start with liquids, then soup and crackers, and gradually work up to solid foods.   Please notify your doctor immediately if you have any unusual bleeding, trouble  breathing, redness and pain at the surgery site, drainage, fever, or pain not relieved by medication.    Additional Instructions:   Please contact your physician with any problems or Same Day Surgery at [phone], Monday through Friday 6 am to 4 pm, or [facility] number at [phone].  

## 2022-12-11 NOTE — Anesthesia Postprocedure Evaluation (Signed)
Anesthesia Post Note  Patient: Vastie Laduke  Procedure(s) Performed: LAPAROSCOPY DIAGNOSTIC (Left: Abdomen) LAPAROSCOPIC OVARIAN CYSTECTOMY AND DETORSION (Left: Abdomen)  Patient location during evaluation: PACU Anesthesia Type: General Level of consciousness: awake and alert Pain management: pain level controlled Vital Signs Assessment: post-procedure vital signs reviewed and stable Respiratory status: spontaneous breathing, nonlabored ventilation, respiratory function stable and patient connected to nasal cannula oxygen Cardiovascular status: blood pressure returned to baseline and stable Postop Assessment: no apparent nausea or vomiting Anesthetic complications: no   No notable events documented.   Last Vitals:  Vitals:   12/11/22 0930 12/11/22 0947  BP: 126/76 128/74  Pulse: 70 72  Resp: 16 16  Temp:    SpO2: 100% 100%    Last Pain:  Vitals:   12/11/22 0947  TempSrc: Oral  PainSc: 0-No pain                 Yevette Edwards

## 2022-12-11 NOTE — H&P (Signed)
Consult History and Physical   SERVICE: Gynecology   Patient Name: Janet Villanueva Patient MRN:   [id number]  CC: Left pelvic pain  HPI: Janet Villanueva is a 27 y.o. G2P0010 at 11wks with suspected Left ovarian torsion, no large cyst on the ovaries by MRI. Presented with now 12 hrs of 9/10 pain, labs positive for vaginal clue cells, no other suspicion for infection.  On 200mg  labetalol for cHTN  Prior surgery: tonsillectomy   Review of Systems: positives in bold GEN:   fevers, chills, weight changes, appetite changes, fatigue, night sweats HEENT:  HA, vision changes, hearing loss, congestion, rhinorrhea, sinus pressure, dysphagia CV:   CP, palpitations PULM:  SOB, cough GI:  abd pain, N/V/D/C GU:  dysuria, urgency, frequency MSK:  arthralgias, myalgias, back pain, swelling SKIN:  rashes, color changes, pallor NEURO:  numbness, weakness, tingling, seizures, dizziness, tremors PSYCH:  depression, anxiety, behavioral problems, confusion  HEME/LYMPH:  easy bruising or bleeding ENDO:  heat/cold intolerance  Past Obstetrical History: OB History     Gravida  2   Para  0   Term  0   Preterm  0   AB  1   Living  0      SAB  1   IAB  0   Ectopic  0   Multiple  0   Live Births              Past Gynecologic History: Patient's last menstrual period was 09/24/2022.   Past Medical History: Past Medical History:  Diagnosis Date   Anemia    Hypertension    IBS (irritable bowel syndrome) 05/20/2015   Seasonal allergic rhinitis 05/30/2021    Past Surgical History:   Past Surgical History:  Procedure Laterality Date   TONSILLECTOMY     TONSILLECTOMY      Family History:  family history includes Asthma in her brother and sister; Cancer in her maternal grandmother; Diabetes in her maternal grandmother; Hypertension in her maternal grandfather; Thyroid disease in her mother.  Social History:  Social History   Socioeconomic History    Marital status: Single    Spouse name: Not on file   Number of children: Not on file   Years of education: Not on file   Highest education level: Not on file  Occupational History   Not on file  Tobacco Use   Smoking status: Never   Smokeless tobacco: Never  Vaping Use   Vaping status: Never Used  Substance and Sexual Activity   Alcohol use: Not Currently    Comment: social   Drug use: No   Sexual activity: Yes    Partners: Male    Birth control/protection: None  Other Topics Concern   Not on file  Social History Narrative   Not on file   Social Determinants of Health   Financial Resource Strain: Not on file  Food Insecurity: Not on file  Transportation Needs: Not on file  Physical Activity: Not on file  Stress: Not on file  Social Connections: Not on file  Intimate Partner Violence: Not on file    Home Medications:  Medications reconciled in EPIC  No current facility-administered medications on file prior to encounter.   Current Outpatient Medications on File Prior to Encounter  Medication Sig Dispense Refill   labetalol (NORMODYNE) 100 MG tablet TAKE ONE TABLET (50 MG) ONCE DAILY BY MOUTH. 90 tablet 1   labetalol (NORMODYNE) 200 MG tablet Take 1 tablet (200 mg total) by mouth  2 (two) times daily. 60 tablet 3   Prenatal Vit-Fe Fumarate-FA (PRENATAL MULTIVITAMIN) TABS tablet Take 1 tablet by mouth daily at 12 noon.      Allergies:  No Known Allergies  Physical Exam:  Temp:  [97.8 F (36.6 C)] 97.8 F (36.6 C) (08/22 2307) Pulse Rate:  [75-145] 90 (08/23 0327) Resp:  [16-18] 16 (08/23 0323) BP: (120-168)/(70-134) 128/70 (08/23 0323) SpO2:  [94 %-100 %] 100 % (08/23 0327) Weight:  [65.8 kg] 65.8 kg (08/22 2305)   General Appearance:  Well developed, well nourished, no acute distress, alert and oriented x3 HEENT:  Normocephalic atraumatic, extraocular movements intact, moist mucous membranes Cardiovascular:  Normal S1/S2, regular rate and rhythm, no  murmurs Pulmonary:  clear to auscultation, no wheezes, rales or rhonchi, symmetric air entry, good air exchange Abdomen:  Bowel sounds present, soft, tender to palpation in midline and left, nondistended, +gravid to approx 4cm above pubic symphysis  Extremities:  Full range of motion, no pedal edema, 2+ distal pulses, no tenderness Skin:  normal coloration and turgor, no rashes, no suspicious skin lesions noted  Neurologic:  Cranial nerves 2-12 grossly intact, normal muscle tone, strength 5/5 all four extremities Psychiatric:  Normal mood and affect, appropriate, no AH/VH Pelvic:  deferred   Labs/Studies:   CBC and Coags:  Lab Results  Component Value Date   WBC 7.2 12/10/2022   NEUTOPHILPCT 55 12/10/2022   EOSPCT 2 12/10/2022   BASOPCT 0 12/10/2022   LYMPHOPCT 33 12/10/2022   HGB 12.3 12/10/2022   HCT 37.1 12/10/2022   MCV 87.1 12/10/2022   PLT 300 12/10/2022   CMP:  Lab Results  Component Value Date   NA 136 12/10/2022   K 3.8 12/10/2022   CL 103 12/10/2022   CO2 22 12/10/2022   BUN 12 12/10/2022   CREATININE 0.82 12/10/2022   CREATININE 0.87 12/01/2022   CREATININE 1.05 (H) 06/01/2022   PROT 7.7 12/10/2022   BILITOT 0.9 12/10/2022   BILIDIR 0.2 09/28/2009   ALT 13 12/10/2022   AST 18 12/10/2022   ALKPHOS 43 12/10/2022   Other Labs:  Other Imaging: MR ABDOMEN WO CONTRAST  Result Date: 12/11/2022 CLINICAL DATA:  Lower abdominal pain, [redacted] weeks pregnant EXAM: MRI ABDOMEN AND PELVIS WITHOUT CONTRAST TECHNIQUE: Multiplanar multisequence MR imaging of the abdomen and pelvis was performed. No intravenous contrast was administered. COMPARISON:  None Available. FINDINGS: COMBINED FINDINGS FOR BOTH MR ABDOMEN AND PELVIS Lower chest: No acute findings. Hepatobiliary: No mass or other parenchymal abnormality identified. Pancreas: No mass, inflammatory changes, or other parenchymal abnormality identified. Spleen:  Within normal limits in size and appearance. Adrenals/Urinary  Tract: No masses identified. No evidence of hydronephrosis. Stomach/Bowel: Evaluation of the stomach, large bowel, and small bowel is slightly limited by respiratory motion artifact, however, the bowel is normal and there is no evidence of obstruction or focal inflammation. The appendix is well visualized anterior to the right psoas and is normal. Vascular/Lymphatic: No pathologically enlarged lymph nodes identified. No abdominal aortic aneurysm demonstrated. Reproductive: Gravid uterus identified. The left ovary is edematous, measuring 2.5 x 4.3 x 4.1 cm (volume = 23 cm^3) and demonstrating central afollicular stroma and peripherally displaced follicles which is asymmetric with the more normal appearing right ovary. There is mild free fluid within the pelvis and asymmetrically within the left adnexa surrounding the left ovary. Together, the findings are suspicious for changes of left ovarian torsion. Corpus luteum noted within the left ovary. The right ovary is unremarkable. Other:  No  abdominal wall hernia. Musculoskeletal: No suspicious bone lesions identified. IMPRESSION: 1. Edematous left ovary with peripherally displaced follicles and mild free fluid within the pelvis and left adnexa. Together, the findings are suspicious for changes of left ovarian torsion. This could be confirmed with transvaginal duplex sonography given the relatively low and posterior position of ovary. 2. Normal appendix. 3. Gravid uterus. Electronically Signed   By: Helyn Numbers M.D.   On: 12/11/2022 03:26   MR PELVIS WO CONTRAST  Result Date: 12/11/2022 CLINICAL DATA:  Lower abdominal pain, [redacted] weeks pregnant EXAM: MRI ABDOMEN AND PELVIS WITHOUT CONTRAST TECHNIQUE: Multiplanar multisequence MR imaging of the abdomen and pelvis was performed. No intravenous contrast was administered. COMPARISON:  None Available. FINDINGS: COMBINED FINDINGS FOR BOTH MR ABDOMEN AND PELVIS Lower chest: No acute findings. Hepatobiliary: No mass or other  parenchymal abnormality identified. Pancreas: No mass, inflammatory changes, or other parenchymal abnormality identified. Spleen:  Within normal limits in size and appearance. Adrenals/Urinary Tract: No masses identified. No evidence of hydronephrosis. Stomach/Bowel: Evaluation of the stomach, large bowel, and small bowel is slightly limited by respiratory motion artifact, however, the bowel is normal and there is no evidence of obstruction or focal inflammation. The appendix is well visualized anterior to the right psoas and is normal. Vascular/Lymphatic: No pathologically enlarged lymph nodes identified. No abdominal aortic aneurysm demonstrated. Reproductive: Gravid uterus identified. The left ovary is edematous, measuring 2.5 x 4.3 x 4.1 cm (volume = 23 cm^3) and demonstrating central afollicular stroma and peripherally displaced follicles which is asymmetric with the more normal appearing right ovary. There is mild free fluid within the pelvis and asymmetrically within the left adnexa surrounding the left ovary. Together, the findings are suspicious for changes of left ovarian torsion. Corpus luteum noted within the left ovary. The right ovary is unremarkable. Other:  No abdominal wall hernia. Musculoskeletal: No suspicious bone lesions identified. IMPRESSION: 1. Edematous left ovary with peripherally displaced follicles and mild free fluid within the pelvis and left adnexa. Together, the findings are suspicious for changes of left ovarian torsion. This could be confirmed with transvaginal duplex sonography given the relatively low and posterior position of ovary. 2. Normal appendix. 3. Gravid uterus. Electronically Signed   By: Helyn Numbers M.D.   On: 12/11/2022 03:26   US OB Comp Less 14 Wks  Result Date: 12/11/2022 CLINICAL DATA:  Pelvic pain affecting pregnancy EXAM: OBSTETRIC <14 WK ULTRASOUND TECHNIQUE: Transabdominal ultrasound was performed for evaluation of the gestation as well as the maternal  uterus and adnexal regions. COMPARISON:  None Available. FINDINGS: Intrauterine gestational sac: Single Yolk sac:  Visualized Embryo:  Visualized Cardiac Activity: Visualized Heart Rate: 164 bpm MSD:  46.4 mm   11 w   3 d CRL:     mm    w  d                  [place] EDC: 06/28/2023 Subchorionic hemorrhage:  Small subchorionic hemorrhage Maternal uterus/adnexae: No adnexal mass or free fluid. IMPRESSION: Eleven week 3 day intrauterine pregnancy. Fetal heart rate 164 beats per minute. Small subchorionic hemorrhage. Electronically Signed   By: Charlett Nose M.D.   On: 12/11/2022 00:26   US OB Limited  Result Date: 12/01/2022 ----------------------------------------------------------------------  OBSTETRICS REPORT                       (Signed Final 12/01/2022 02:41 pm) ---------------------------------------------------------------------- Patient Info  ID #:       [id number]  D.O.B.:  [birth year] (27 yrs)  Name:       Janet Villanueva                  Visit Date: 12/01/2022 01:57 pm              Janet Villanueva ---------------------------------------------------------------------- Performed By  Attending:        Milas Hock MD        Ref. Address:     [address]                                                             [address]  Performed By:     Scheryl Marten RN     [facility]         [facility]                                                             [facility]  Referred By:      [facility] Mila Merry ---------------------------------------------------------------------- Orders  #  Description                           Code        Ordered By  1  US OB LIMITED                         [id number]     Milas Hock ----------------------------------------------------------------------  #  Order #                     Accession #                Episode #  1  [id number]                    [id number]                 [id number] ---------------------------------------------------------------------- Indications  [redacted] weeks gestation of pregnancy                 Z3A.09 ---------------------------------------------------------------------- Fetal Evaluation  Num Of Fetuses:         1  Preg. Location:         Intrauterine  Gest. Sac:              Intrauterine  Yolk  Sac:               Visualized  Fetal Pole:             Visualized  Fetal Heart Rate(bpm):  168  Cardiac Activity:       Observed ---------------------------------------------------------------------- Biometry  CRL:      34.1  mm     G. Age:  10w 1d                  EDD:   06/28/23 ---------------------------------------------------------------------- OB History  Gravidity:    2         Term:   0        Prem:   0        SAB:   1  TOP:          0       Ectopic:  0        Living: 0 ---------------------------------------------------------------------- Gestational Age  LMP:           9w 5d         Date:  09/24/22                   EDD:   07/01/23  Best:          Grace Isaac 5d      Det. By:  LMP  (09/24/22)          EDD:   07/01/23 ---------------------------------------------------------------------- Impression  Single living IUP. CRL consistent with LMP provided. ---------------------------------------------------------------------- Recommendations  Follow up as clinically indicated. ----------------------------------------------------------------------                 Milas Hock, MD Electronically Signed Final Report   12/01/2022 02:41 pm ----------------------------------------------------------------------     Assessment / Plan:   Evangeline Gula is a 27 y.o. G2P0010 who presents with 12 hours of pelvic pain, with suspicion for ovarian torsion. Unusually, she has no large ovarian mass and is late first trimester.   Consents signed today. Risks of dx lap with possible oophorectomy and salpingectomy and evacuation of hemoperitoneum if present  were discussed with the patient including but not limited to: bleeding which may require transfusion; infection which may require antibiotics; injury to uterus or surrounding organs; need for additional procedures including laparotomy or laparoscopy; and other postoperative/anesthesia complications. Written informed consent was obtained.  Plan to treat with outpatient flagyl po.  This is a scheduled same-day surgery. She will have a postop visit in 2 weeks to review operative findings and pathology.

## 2022-12-11 NOTE — ED Notes (Signed)
Patient to MRI at this time with tech Alexis. Jewelry removed prior to exiting room and given to partner

## 2022-12-11 NOTE — Progress Notes (Signed)
FHT 152 at 0710

## 2022-12-11 NOTE — Anesthesia Procedure Notes (Addendum)
Procedure Name: Intubation Date/Time: 12/11/2022 2:49 AM  Performed by: Cheral Bay, CRNAPre-anesthesia Checklist: Patient identified, Emergency Drugs available, Suction available and Patient being monitored Patient Re-evaluated:Patient Re-evaluated prior to induction Oxygen Delivery Method: Circle system utilized Preoxygenation: Pre-oxygenation with 100% oxygen Induction Type: IV induction Ventilation: Mask ventilation without difficulty Laryngoscope Size: McGraph and 3 Grade View: Grade I Tube type: Oral Tube size: 7.0 mm Number of attempts: 1 Airway Equipment and Method: Stylet Placement Confirmation: ETT inserted through vocal cords under direct vision, positive ETCO2 and breath sounds checked- equal and bilateral Secured at: 21 cm Tube secured with: Tape Dental Injury: Teeth and Oropharynx as per pre-operative assessment

## 2022-12-11 NOTE — Transfer of Care (Signed)
Immediate Anesthesia Transfer of Care Note  Patient: Janet Villanueva  Procedure(s) Performed: LAPAROSCOPY DIAGNOSTIC (Left: Abdomen) LAPAROSCOPIC OVARIAN CYSTECTOMY AND DETORSION (Left: Abdomen)  Patient Location: PACU  Anesthesia Type:General  Level of Consciousness: awake and drowsy  Airway & Oxygen Therapy: Patient Spontanous Breathing  Post-op Assessment: Report given to RN and Post -op Vital signs reviewed and stable  Post vital signs: Reviewed and stable  Last Vitals:  Vitals Value Taken Time  BP 131/84 12/11/22 0900  Temp    Pulse 73 12/11/22 0900  Resp 18 12/11/22 0900  SpO2 100 % 12/11/22 0900  Vitals shown include unfiled device data.  Last Pain:  Vitals:   12/11/22 0115  TempSrc:   PainSc: 1          Complications: No notable events documented.

## 2022-12-11 NOTE — Progress Notes (Signed)
FHT=134 

## 2022-12-23 ENCOUNTER — Ambulatory Visit (INDEPENDENT_AMBULATORY_CARE_PROVIDER_SITE_OTHER): Payer: BC Managed Care – PPO | Admitting: Family Medicine

## 2022-12-23 ENCOUNTER — Encounter: Payer: Self-pay | Admitting: Family Medicine

## 2022-12-23 ENCOUNTER — Other Ambulatory Visit (HOSPITAL_COMMUNITY)
Admission: RE | Admit: 2022-12-23 | Discharge: 2022-12-23 | Disposition: A | Discharge status: Home / Self Care | Payer: BC Managed Care – PPO | Source: Ambulatory Visit | Attending: Family Medicine | Admitting: Family Medicine

## 2022-12-23 VITALS — BP 130/84 | HR 92 | Wt 151.0 lb

## 2022-12-23 DIAGNOSIS — I10 Essential (primary) hypertension: Secondary | ICD-10-CM | POA: Diagnosis not present

## 2022-12-23 DIAGNOSIS — O0990 Supervision of high risk pregnancy, unspecified, unspecified trimester: Secondary | ICD-10-CM | POA: Diagnosis not present

## 2022-12-23 DIAGNOSIS — Z124 Encounter for screening for malignant neoplasm of cervix: Secondary | ICD-10-CM

## 2022-12-23 DIAGNOSIS — Z1151 Encounter for screening for human papillomavirus (HPV): Secondary | ICD-10-CM | POA: Insufficient documentation

## 2022-12-23 DIAGNOSIS — Z01411 Encounter for gynecological examination (general) (routine) with abnormal findings: Secondary | ICD-10-CM | POA: Insufficient documentation

## 2022-12-23 DIAGNOSIS — Z1339 Encounter for screening examination for other mental health and behavioral disorders: Secondary | ICD-10-CM

## 2022-12-23 DIAGNOSIS — O10019 Pre-existing essential hypertension complicating pregnancy, unspecified trimester: Secondary | ICD-10-CM

## 2022-12-23 DIAGNOSIS — O1211 Gestational proteinuria, first trimester: Secondary | ICD-10-CM

## 2022-12-23 DIAGNOSIS — Z113 Encounter for screening for infections with a predominantly sexual mode of transmission: Secondary | ICD-10-CM | POA: Insufficient documentation

## 2022-12-23 DIAGNOSIS — Z3A12 12 weeks gestation of pregnancy: Secondary | ICD-10-CM

## 2022-12-23 DIAGNOSIS — R87610 Atypical squamous cells of undetermined significance on cytologic smear of cervix (ASC-US): Secondary | ICD-10-CM | POA: Insufficient documentation

## 2022-12-23 MED ORDER — ASPIRIN 81 MG PO CHEW
162.0000 mg | CHEWABLE_TABLET | Freq: Every day | ORAL | 3 refills | Status: DC
Start: 2022-12-23 — End: 2023-06-16

## 2022-12-23 NOTE — Progress Notes (Signed)
Subjective:   Janet Villanueva is a 27 y.o. G2P0010 at [redacted]w[redacted]d by LMP, early ultrasound being seen today for her first obstetrical visit.  Her obstetrical history is significant for  hypertension with significant proteinuria . Patient does intend to breast feed. Pregnancy history fully reviewed.  Patient reports no complaints.  HISTORY: OB History  Gravida Para Term Preterm AB Living  2 0 0 0 1 0  SAB IAB Ectopic Multiple Live Births  1 0 0 0 0    # Outcome Date GA Lbr Len/2nd Weight Sex Type Anes PTL Lv  2 Current           1 SAB 2021           Past Medical History:  Diagnosis Date   Anemia    Hypertension    IBS (irritable bowel syndrome) 05/20/2015   Seasonal allergic rhinitis 05/30/2021   Past Surgical History:  Procedure Laterality Date   LAPAROSCOPIC OVARIAN CYSTECTOMY Left 12/11/2022   Procedure: LAPAROSCOPIC OVARIAN CYSTECTOMY AND DETORSION;  Surgeon: Christeen Douglas, MD;  Location: [facility];  Service: Gynecology;  Laterality: Left;   LAPAROSCOPY Left 12/11/2022   Procedure: LAPAROSCOPY DIAGNOSTIC;  Surgeon: Christeen Douglas, MD;  Location: [facility];  Service: Gynecology;  Laterality: Left;   TONSILLECTOMY     TONSILLECTOMY     Family History  Problem Relation Age of Onset   Cancer Maternal Grandmother        breast   Diabetes Maternal Grandmother    Hypertension Maternal Grandfather    Thyroid disease Mother    Asthma Sister    Asthma Brother    Social History   Tobacco Use   Smoking status: Never   Smokeless tobacco: Never  Vaping Use   Vaping status: Never Used  Substance Use Topics   Alcohol use: Not Currently    Comment: social   Drug use: No   No Known Allergies Current Outpatient Medications on File Prior to Visit  Medication Sig Dispense Refill   labetalol (NORMODYNE) 200 MG tablet Take 1 tablet (200 mg total) by mouth 2 (two) times daily. 60 tablet 3   Prenatal Vit-Fe Fumarate-FA (PRENATAL MULTIVITAMIN) TABS tablet Take 1 tablet by  mouth daily at 12 noon.     docusate sodium (COLACE) 100 MG capsule Take 1 capsule (100 mg total) by mouth 2 (two) times daily. To keep stools soft (Patient not taking: Reported on 12/23/2022) 30 capsule 0   oxyCODONE (OXY IR/ROXICODONE) 5 MG immediate release tablet Take 1 tablet (5 mg total) by mouth every 4 (four) hours as needed for severe pain. (Patient not taking: Reported on 12/23/2022) 10 tablet 0   No current facility-administered medications on file prior to visit.     Exam   Vitals:   12/23/22 1514  BP: 130/84  Pulse: 92  Weight: 151 lb (68.5 kg)   Fetal Heart Rate (bpm): 156  Uterus:     Pelvic Exam: Perineum: no hemorrhoids, normal perineum   Vulva: normal external genitalia, no lesions   Vagina:  normal mucosa, normal discharge   Cervix: no lesions and normal, pap smear done.    Adnexa: normal adnexa and no mass, fullness, tenderness   Bony Pelvis: average  System: General: well-developed, well-nourished female in no acute distress   Breast:  normal appearance, no masses or tenderness   Skin: normal coloration and turgor, no rashes   Neurologic: oriented, normal, negative, normal mood   Extremities: normal strength, tone, and muscle mass,  ROM of all joints is normal   HEENT PERRLA, extraocular movement intact and sclera clear, anicteric   Mouth/Teeth mucous membranes moist, pharynx normal without lesions and dental hygiene good   Neck supple and no masses   Cardiovascular: regular rate and rhythm   Respiratory:  no respiratory distress, normal breath sounds   Abdomen: soft, non-tender; bowel sounds normal; no masses,  no organomegaly     Assessment:   Pregnancy: G2P0010 Patient Active Problem List   Diagnosis Date Noted   Hypertension    Proteinuria affecting pregnancy 12/02/2022   Supervision of high risk pregnancy, antepartum 12/01/2022   Essential hypertension in pregnancy 11/15/2019     Plan:  1. Proteinuria affecting pregnancy in first trimester With  HTN--? Underlying kidney disease vs. HTN only - Ambulatory referral to Nephrology  2. Supervision of high risk pregnancy, antepartum New OB labs done and reviewed Genetics today - PANORAMA PRENATAL TEST - HORIZON Basic Panel - [place] MFM OB DETAIL +14 WK; Future  3. Primary hypertension - Ambulatory referral to Nephrology  4. Screening for cervical cancer - Cytology - PAP  5. Essential hypertension in pregnancy Baseline labs done and WNL except for proteinuria - aspirin 81 MG chewable tablet; Chew 2 tablets (162 mg total) by mouth daily.  Dispense: 180 tablet; Refill: 3  6. [redacted] weeks gestation of pregnancy    Initial labs reviewed. Continue prenatal vitamins. Genetic Screening discussed, NIPS: ordered. Ultrasound discussed; fetal anatomic survey: ordered. Problem list reviewed and updated. The nature of [facility] with multiple MDs and other Advanced Practice Providers was explained to patient; also emphasized that residents, students are part of our team. Routine obstetric precautions reviewed. Return in 4 weeks (on 01/20/2023).

## 2022-12-29 LAB — CYTOLOGY - PAP
Chlamydia: NEGATIVE
Comment: NEGATIVE
Comment: NEGATIVE
Comment: NORMAL
Diagnosis: UNDETERMINED — AB
High risk HPV: NEGATIVE
Neisseria Gonorrhea: NEGATIVE

## 2022-12-30 LAB — PANORAMA PRENATAL TEST FULL PANEL:PANORAMA TEST PLUS 5 ADDITIONAL MICRODELETIONS: FETAL FRACTION: 10.6

## 2023-01-01 ENCOUNTER — Encounter: Payer: Self-pay | Admitting: Family Medicine

## 2023-01-01 LAB — HORIZON CUSTOM: REPORT SUMMARY: POSITIVE — AB

## 2023-01-21 ENCOUNTER — Ambulatory Visit (INDEPENDENT_AMBULATORY_CARE_PROVIDER_SITE_OTHER): Payer: BC Managed Care – PPO | Admitting: Obstetrics & Gynecology

## 2023-01-21 ENCOUNTER — Encounter: Payer: Self-pay | Admitting: Obstetrics & Gynecology

## 2023-01-21 VITALS — BP 127/79 | HR 90 | Wt 155.0 lb

## 2023-01-21 DIAGNOSIS — R519 Headache, unspecified: Secondary | ICD-10-CM

## 2023-01-21 DIAGNOSIS — Z148 Genetic carrier of other disease: Secondary | ICD-10-CM

## 2023-01-21 DIAGNOSIS — Z3A17 17 weeks gestation of pregnancy: Secondary | ICD-10-CM

## 2023-01-21 DIAGNOSIS — O0990 Supervision of high risk pregnancy, unspecified, unspecified trimester: Secondary | ICD-10-CM

## 2023-01-21 DIAGNOSIS — R399 Unspecified symptoms and signs involving the genitourinary system: Secondary | ICD-10-CM

## 2023-01-21 DIAGNOSIS — O1212 Gestational proteinuria, second trimester: Secondary | ICD-10-CM

## 2023-01-21 DIAGNOSIS — O26892 Other specified pregnancy related conditions, second trimester: Secondary | ICD-10-CM

## 2023-01-21 DIAGNOSIS — O10019 Pre-existing essential hypertension complicating pregnancy, unspecified trimester: Secondary | ICD-10-CM

## 2023-01-21 HISTORY — DX: Genetic carrier of other disease: Z14.8

## 2023-01-21 LAB — POCT URINALYSIS DIPSTICK
Blood, UA: NEGATIVE
Glucose, UA: NEGATIVE
Leukocytes, UA: NEGATIVE
Nitrite, UA: NEGATIVE
Protein, UA: POSITIVE — AB

## 2023-01-21 NOTE — Progress Notes (Signed)
PRENATAL VISIT NOTE  Subjective:  Janet Villanueva is a 27 y.o. G2P0010 at [redacted]w[redacted]d being seen today for ongoing prenatal care.  She is currently monitored for the following issues for this high-risk pregnancy and has Essential hypertension in pregnancy; Supervision of high risk pregnancy, antepartum; Proteinuria affecting pregnancy; and Carrier of spinal muscular atrophy on their problem list.  Patient reports  occasional headaches, managed on her low dose ASA (162 mg) and a little caffeine.  Also feels her bladder is full all the time, and has urgency. No dysuria, no back pain, no abdominal pain  .   . Vag. Bleeding: None.  Movement: Absent. Denies leaking of fluid.   The following portions of the patient's history were reviewed and updated as appropriate: allergies, current medications, past family history, past medical history, past social history, past surgical history and problem list.   Objective:   Vitals:   01/21/23 1616  BP: 127/79  Pulse: 90  Weight: 155 lb (70.3 kg)    Fetal Status:     Movement: Absent     General:  Alert, oriented and cooperative. Patient is in no acute distress.  Skin: Skin is warm and dry. No rash noted.   Cardiovascular: Normal heart rate noted  Respiratory: Normal respiratory effort, no problems with respiration noted  Abdomen: Soft, gravid, appropriate for gestational age.  Pain/Pressure: Present     Pelvic: Cervical exam deferred        Extremities: Normal range of motion.  Edema: None  Mental Status: Normal mood and affect. Normal behavior. Normal judgment and thought content.   Results for orders placed or performed in visit on 01/21/23 (from the past 24 hour(s))  POCT Urinalysis Dipstick     Status: Abnormal   Collection Time: 01/21/23  4:42 PM  Result Value Ref Range   Color, UA     Clarity, UA     Glucose, UA Negative Negative   Bilirubin, UA     Ketones, UA     Spec Grav, UA     Blood, UA Negative    pH, UA     Protein, UA  Positive (A) Negative   Urobilinogen, UA     Nitrite, UA Negative    Leukocytes, UA Negative Negative   Appearance     Odor      Assessment and Plan:  Pregnancy: G2P0010 at [redacted]w[redacted]d 1. Essential hypertension in pregnancy Stable BP on Labetolol - POCT Urinalysis Dipstick  - Comprehensive metabolic panel  2. Proteinuria affecting pregnancy in second trimester Already saw Nephrologist, awaiting lab results and recommendations.  3. Pregnancy headache in second trimester Recommended addition of Tylenol 1000 mg po q6h as needed.  If headaches continue, consider Imitrex.   4. UTI symptoms - POCT Urinalysis Dipstick not concerning for overt infection - Culture, OB Urine sent to make sure nothing is being missed Will follow up results and manage accordingly.  5. Carrier of spinal muscular atrophy FOB to do carrier screening, will follow up results and manage accordingly.  6. [redacted] weeks gestation of pregnancy 7. Supervision of high risk pregnancy, antepartum Already scheduled for anatomy scan. LR NIPS. - AFP, Serum, Open Spina Bifida done today, will follow up results and manage accordingly. No other complaints or concerns.  Routine obstetric precautions reviewed.  Please refer to After Visit Summary for other counseling recommendations.   Return in about 4 weeks (around 02/18/2023) for OFFICE OB VISIT (MD only).  Future Appointments  Date Time Provider Department Center  02/02/2023  1:15 PM [facility] NURSE [facility]  02/02/2023  1:30 PM [facility] US3 [facility]  02/18/2023  4:10 PM [place] Bing, MD [facility]  03/17/2023  3:30 PM Reva Bores, MD [facility]    Jaynie Collins, MD

## 2023-01-23 LAB — AFP, SERUM, OPEN SPINA BIFIDA
AFP MoM: 1.34
AFP Value: 54.4 ng/mL
Gest. Age on Collection Date: 17 wk
Maternal Age At EDD: 28.6 a
OSBR Risk 1 IN: 8546
Test Results:: NEGATIVE
Weight: 155 [lb_av]

## 2023-01-23 LAB — COMPREHENSIVE METABOLIC PANEL
ALT: 9 [IU]/L (ref 0–32)
AST: 14 [IU]/L (ref 0–40)
Albumin: 4.1 g/dL (ref 4.0–5.0)
Alkaline Phosphatase: 68 [IU]/L (ref 44–121)
BUN/Creatinine Ratio: 8 — ABNORMAL LOW (ref 9–23)
BUN: 7 mg/dL (ref 6–20)
Bilirubin Total: 0.6 mg/dL (ref 0.0–1.2)
CO2: 20 mmol/L (ref 20–29)
Calcium: 10.2 mg/dL (ref 8.7–10.2)
Chloride: 102 mmol/L (ref 96–106)
Creatinine, Ser: 0.92 mg/dL (ref 0.57–1.00)
Globulin, Total: 3.2 g/dL (ref 1.5–4.5)
Glucose: 84 mg/dL (ref 70–99)
Potassium: 4.3 mmol/L (ref 3.5–5.2)
Sodium: 138 mmol/L (ref 134–144)
Total Protein: 7.3 g/dL (ref 6.0–8.5)
eGFR: 87 mL/min/{1.73_m2} (ref >59)

## 2023-01-23 LAB — URINE CULTURE, OB REFLEX

## 2023-01-23 LAB — CULTURE, OB URINE

## 2023-02-02 ENCOUNTER — Ambulatory Visit: Payer: BC Managed Care – PPO | Attending: Family Medicine

## 2023-02-02 ENCOUNTER — Encounter: Payer: Self-pay | Admitting: *Deleted

## 2023-02-02 ENCOUNTER — Ambulatory Visit: Payer: BC Managed Care – PPO | Admitting: *Deleted

## 2023-02-02 ENCOUNTER — Other Ambulatory Visit: Payer: Self-pay | Admitting: *Deleted

## 2023-02-02 ENCOUNTER — Other Ambulatory Visit: Payer: Self-pay

## 2023-02-02 VITALS — BP 125/76 | HR 78

## 2023-02-02 DIAGNOSIS — O0990 Supervision of high risk pregnancy, unspecified, unspecified trimester: Secondary | ICD-10-CM | POA: Insufficient documentation

## 2023-02-02 DIAGNOSIS — O10019 Pre-existing essential hypertension complicating pregnancy, unspecified trimester: Secondary | ICD-10-CM | POA: Insufficient documentation

## 2023-02-02 DIAGNOSIS — Z148 Genetic carrier of other disease: Secondary | ICD-10-CM

## 2023-02-02 DIAGNOSIS — O10919 Unspecified pre-existing hypertension complicating pregnancy, unspecified trimester: Secondary | ICD-10-CM

## 2023-02-02 DIAGNOSIS — O99012 Anemia complicating pregnancy, second trimester: Secondary | ICD-10-CM | POA: Diagnosis not present

## 2023-02-02 DIAGNOSIS — D649 Anemia, unspecified: Secondary | ICD-10-CM | POA: Diagnosis not present

## 2023-02-02 DIAGNOSIS — O285 Abnormal chromosomal and genetic finding on antenatal screening of mother: Secondary | ICD-10-CM | POA: Diagnosis not present

## 2023-02-02 DIAGNOSIS — Z3A18 18 weeks gestation of pregnancy: Secondary | ICD-10-CM

## 2023-02-02 DIAGNOSIS — O10012 Pre-existing essential hypertension complicating pregnancy, second trimester: Secondary | ICD-10-CM | POA: Diagnosis not present

## 2023-02-18 ENCOUNTER — Ambulatory Visit (INDEPENDENT_AMBULATORY_CARE_PROVIDER_SITE_OTHER): Payer: BC Managed Care – PPO | Admitting: Obstetrics and Gynecology

## 2023-02-18 ENCOUNTER — Encounter: Payer: Self-pay | Admitting: Obstetrics and Gynecology

## 2023-02-18 VITALS — BP 129/77 | HR 97 | Wt 161.0 lb

## 2023-02-18 DIAGNOSIS — O1212 Gestational proteinuria, second trimester: Secondary | ICD-10-CM

## 2023-02-18 DIAGNOSIS — O10019 Pre-existing essential hypertension complicating pregnancy, unspecified trimester: Secondary | ICD-10-CM

## 2023-02-18 DIAGNOSIS — Z3A20 20 weeks gestation of pregnancy: Secondary | ICD-10-CM

## 2023-02-18 NOTE — Progress Notes (Signed)
ROB  Had Anatomy U/S.

## 2023-02-19 NOTE — Progress Notes (Addendum)
   PRENATAL VISIT NOTE  Subjective:  Nakeita Styles is a 27 y.o. G2P0010 at [redacted]w[redacted]d being seen today for ongoing prenatal care.  She is currently monitored for the following issues for this high-risk pregnancy and has Essential hypertension in pregnancy; Supervision of high risk pregnancy, antepartum; Proteinuria affecting pregnancy; and Carrier of spinal muscular atrophy on their problem list.  Patient reports no complaints.  Contractions: Not present. Vag. Bleeding: None.  Movement: Present. Denies leaking of fluid.   The following portions of the patient's history were reviewed and updated as appropriate: allergies, current medications, past family history, past medical history, past social history, past surgical history and problem list.   Objective:   Vitals:   02/18/23 1624  BP: 129/77  Pulse: 97  Weight: 161 lb (73 kg)    Fetal Status: Fetal Heart Rate (bpm): 140   Movement: Present     General:  Alert, oriented and cooperative. Patient is in no acute distress.  Skin: Skin is warm and dry. No rash noted.   Cardiovascular: Normal heart rate noted  Respiratory: Normal respiratory effort, no problems with respiration noted  Abdomen: Soft, gravid, appropriate for gestational age.  Pain/Pressure: Absent     Pelvic: Cervical exam deferred        Extremities: Normal range of motion.  Edema: None  Mental Status: Normal mood and affect. Normal behavior. Normal judgment and thought content.   Assessment and Plan:  Pregnancy: G2P0010 at [redacted]w[redacted]d 1. [redacted] weeks gestation of pregnancy Afp negative and anatomy u/s negative on 10/15  2. Essential hypertension in pregnancy Doing well on labetalol 200 bid. Continue low dose ASA  3. Proteinuria affecting pregnancy in second trimester Baseline PC ratio in the 900s; 24h urine not done. Patient referred to Nephro and she states that she saw them (office in [place]) and nothing to do. She states she has routine f/u with them later this  year.   Preterm labor symptoms and general obstetric precautions including but not limited to vaginal bleeding, contractions, leaking of fluid and fetal movement were reviewed in detail with the patient. Please refer to After Visit Summary for other counseling recommendations.   No follow-ups on file.  Future Appointments  Date Time Provider Department Center  03/09/2023  3:30 PM [facility] US3 [facility]  03/17/2023  3:30 PM Reva Bores, MD [facility]  04/15/2023  8:30 AM [facility] LAB [facility]  04/15/2023  8:55 AM [place] Bing, MD [facility]    [place] Bing, MD

## 2023-03-09 ENCOUNTER — Ambulatory Visit: Payer: BC Managed Care – PPO | Attending: Obstetrics

## 2023-03-09 ENCOUNTER — Encounter: Payer: Self-pay | Admitting: Family Medicine

## 2023-03-09 ENCOUNTER — Other Ambulatory Visit: Payer: Self-pay

## 2023-03-09 DIAGNOSIS — O10012 Pre-existing essential hypertension complicating pregnancy, second trimester: Secondary | ICD-10-CM | POA: Diagnosis not present

## 2023-03-09 DIAGNOSIS — O285 Abnormal chromosomal and genetic finding on antenatal screening of mother: Secondary | ICD-10-CM | POA: Diagnosis not present

## 2023-03-09 DIAGNOSIS — O99012 Anemia complicating pregnancy, second trimester: Secondary | ICD-10-CM | POA: Diagnosis not present

## 2023-03-09 DIAGNOSIS — Z148 Genetic carrier of other disease: Secondary | ICD-10-CM

## 2023-03-09 DIAGNOSIS — Z3A23 23 weeks gestation of pregnancy: Secondary | ICD-10-CM

## 2023-03-09 DIAGNOSIS — D649 Anemia, unspecified: Secondary | ICD-10-CM

## 2023-03-09 DIAGNOSIS — O10919 Unspecified pre-existing hypertension complicating pregnancy, unspecified trimester: Secondary | ICD-10-CM | POA: Insufficient documentation

## 2023-03-10 ENCOUNTER — Other Ambulatory Visit: Payer: Self-pay | Admitting: *Deleted

## 2023-03-10 DIAGNOSIS — O10912 Unspecified pre-existing hypertension complicating pregnancy, second trimester: Secondary | ICD-10-CM

## 2023-03-17 ENCOUNTER — Ambulatory Visit: Payer: Managed Care, Other (non HMO) | Admitting: Obstetrics and Gynecology

## 2023-03-17 VITALS — BP 136/84 | HR 99 | Wt 172.4 lb

## 2023-03-17 DIAGNOSIS — O365930 Maternal care for other known or suspected poor fetal growth, third trimester, not applicable or unspecified: Secondary | ICD-10-CM | POA: Insufficient documentation

## 2023-03-17 DIAGNOSIS — O403XX0 Polyhydramnios, third trimester, not applicable or unspecified: Secondary | ICD-10-CM | POA: Insufficient documentation

## 2023-03-17 DIAGNOSIS — Z3A24 24 weeks gestation of pregnancy: Secondary | ICD-10-CM

## 2023-03-17 DIAGNOSIS — O365920 Maternal care for other known or suspected poor fetal growth, second trimester, not applicable or unspecified: Secondary | ICD-10-CM

## 2023-03-17 DIAGNOSIS — O10019 Pre-existing essential hypertension complicating pregnancy, unspecified trimester: Secondary | ICD-10-CM

## 2023-03-17 DIAGNOSIS — O402XX0 Polyhydramnios, second trimester, not applicable or unspecified: Secondary | ICD-10-CM

## 2023-03-17 MED ORDER — FAMOTIDINE 20 MG PO TABS: 20.0000 mg | ORAL_TABLET | Freq: Two times a day (BID) | ORAL | 3 refills | Status: DC

## 2023-03-17 NOTE — Progress Notes (Signed)
ROB: Wants to discuss Acid reflux

## 2023-03-17 NOTE — Progress Notes (Signed)
   PRENATAL VISIT NOTE  Subjective:  Janet Villanueva is a 27 y.o. G2P0010 at [redacted]w[redacted]d being seen today for ongoing prenatal care.  She is currently monitored for the following issues for this high-risk pregnancy and has Essential hypertension in pregnancy; Supervision of high risk pregnancy, antepartum; Proteinuria affecting pregnancy; Carrier of spinal muscular atrophy; Poor fetal growth affecting management of mother in second trimester; and Polyhydramnios in second trimester on their problem list.  Patient reports no complaints.  Contractions: Not present. Vag. Bleeding: None.  Movement: Present. Denies leaking of fluid.   The following portions of the patient's history were reviewed and updated as appropriate: allergies, current medications, past family history, past medical history, past social history, past surgical history and problem list.   Objective:   Vitals:   03/17/23 1535  BP: 136/84  Pulse: 99  Weight: 172 lb 6.4 oz (78.2 kg)    Fetal Status: Fetal Heart Rate (bpm): 147   Movement: Present     General:  Alert, oriented and cooperative. Patient is in no acute distress.  Skin: Skin is warm and dry. No rash noted.   Cardiovascular: Normal heart rate noted  Respiratory: Normal respiratory effort, no problems with respiration noted  Abdomen: Soft, gravid, appropriate for gestational age.  Pain/Pressure: Absent     Pelvic: Cervical exam deferred        Extremities: Normal range of motion.  Edema: None  Mental Status: Normal mood and affect. Normal behavior. Normal judgment and thought content.   Assessment and Plan:  Pregnancy: G2P0010 at [redacted]w[redacted]d 1. Poor fetal growth affecting management of mother in second trimester, single or unspecified fetus 11/19: 13%, 547gm, ac 20%, afi 9 Follow up repeat scans  2. Polyhydramnios in second trimester, single or unspecified fetus Early A1c wnl. Panorama low risk. Mfm anatomy u/s neg  3. Essential hypertension in  pregnancy Continue on labetalol 200 bid and low dose ASA  4. Pregnancy GTT, 28wk labs next visit.   Preterm labor symptoms and general obstetric precautions including but not limited to vaginal bleeding, contractions, leaking of fluid and fetal movement were reviewed in detail with the patient. Please refer to After Visit Summary for other counseling recommendations.   No follow-ups on file.  Future Appointments  Date Time Provider Department Center  04/15/2023  8:30 AM [facility] LAB [facility]  04/15/2023  8:55 AM [place] Bing, MD [facility]  04/16/2023  2:30 PM [facility] US5 [facility]    [place] Bing, MD

## 2023-03-26 ENCOUNTER — Ambulatory Visit (INDEPENDENT_AMBULATORY_CARE_PROVIDER_SITE_OTHER): Payer: BC Managed Care – PPO | Admitting: Obstetrics and Gynecology

## 2023-03-26 ENCOUNTER — Other Ambulatory Visit (HOSPITAL_COMMUNITY)
Admission: RE | Admit: 2023-03-26 | Discharge: 2023-03-26 | Disposition: A | Discharge status: Home / Self Care | Payer: BC Managed Care – PPO | Source: Ambulatory Visit | Attending: Obstetrics and Gynecology | Admitting: Obstetrics and Gynecology

## 2023-03-26 VITALS — BP 122/74 | HR 96 | Wt 173.2 lb

## 2023-03-26 DIAGNOSIS — O4692 Antepartum hemorrhage, unspecified, second trimester: Secondary | ICD-10-CM

## 2023-03-26 DIAGNOSIS — O23592 Infection of other part of genital tract in pregnancy, second trimester: Secondary | ICD-10-CM | POA: Insufficient documentation

## 2023-03-26 DIAGNOSIS — Z3A26 26 weeks gestation of pregnancy: Secondary | ICD-10-CM

## 2023-03-26 DIAGNOSIS — B9689 Other specified bacterial agents as the cause of diseases classified elsewhere: Secondary | ICD-10-CM | POA: Diagnosis not present

## 2023-03-26 NOTE — Progress Notes (Signed)
ROB: Pt stating that over the last few days she has been seeing dime sized blood when wiping, denies any urinary symptoms

## 2023-03-26 NOTE — Progress Notes (Signed)
   PRENATAL VISIT NOTE  Work in visit for vaginal bleeding/spotting  Subjective:  Janet Villanueva is a 27 y.o. G2P0010 at [redacted]w[redacted]d being seen today for ongoing prenatal care.  She is currently monitored for the following issues for this high-risk pregnancy and has Essential hypertension in pregnancy; Supervision of high risk pregnancy, antepartum; Proteinuria affecting pregnancy; Carrier of spinal muscular atrophy; Poor fetal growth affecting management of mother in second trimester; and Polyhydramnios in second trimester on their problem list.  Patient reports  last noticed yesterday, BRB spot with wiping, see RN messages. Never any pain. Last intercourse on Saturday and spotting first noticed on Wednesday .  Contractions: Irregular. Vag. Bleeding: Small.  Movement: Present. Denies leaking of fluid.   The following portions of the patient's history were reviewed and updated as appropriate: allergies, current medications, past family history, past medical history, past social history, past surgical history and problem list.   Objective:   Vitals:   03/26/23 1021  BP: 122/74  Pulse: 96  Weight: 173 lb 3.2 oz (78.6 kg)    Fetal Status: Fetal Heart Rate (bpm): 147   Movement: Present     General:  Alert, oriented and cooperative. Patient is in no acute distress.  Skin: Skin is warm and dry. No rash noted.   Cardiovascular: Normal heart rate noted  Respiratory: Normal respiratory effort, no problems with respiration noted  Abdomen: Soft, gravid, appropriate for gestational age.  Pain/Pressure: Present     Pelvic: Cervical exam performed in the presence of a chaperone       EGBUS wnl, vagina wnl, cervix wnl visually closed. There is some increased vascularity to the cervix but not overtly friable  Extremities: Normal range of motion.  Edema: None  Mental Status: Normal mood and affect. Normal behavior. Normal judgment and thought content.   Assessment and Plan:  Pregnancy: G2P0010 at  [redacted]w[redacted]d 1. Vaginal bleeding in pregnancy, second trimester ED precautions given. Pelvic rest until next visit - Cervicovaginal ancillary only( [place])  2. [redacted] weeks gestation of pregnancy BP wnl.  - Cervicovaginal ancillary only( [place])  Preterm labor symptoms and general obstetric precautions including but not limited to vaginal bleeding, contractions, leaking of fluid and fetal movement were reviewed in detail with the patient. Please refer to After Visit Summary for other counseling recommendations.   No follow-ups on file.  Future Appointments  Date Time Provider Department Center  04/15/2023  8:30 AM [facility] LAB [facility]  04/15/2023  8:55 AM [place] Bing, MD [facility]  04/16/2023  2:30 PM [facility] US5 [facility]    [place] Bing, MD

## 2023-03-29 LAB — CERVICOVAGINAL ANCILLARY ONLY
Bacterial Vaginitis (gardnerella): POSITIVE — AB
Candida Glabrata: NEGATIVE
Candida Vaginitis: NEGATIVE
Chlamydia: NEGATIVE
Comment: NEGATIVE
Comment: NEGATIVE
Comment: NEGATIVE
Comment: NEGATIVE
Comment: NEGATIVE
Comment: NORMAL
Neisseria Gonorrhea: NEGATIVE
Trichomonas: NEGATIVE

## 2023-03-29 MED ORDER — METRONIDAZOLE 500 MG PO TABS: 500.0000 mg | ORAL_TABLET | Freq: Two times a day (BID) | ORAL | 0 refills | Status: DC

## 2023-03-29 NOTE — Addendum Note (Signed)
Addended by: [place] Bing on: 03/29/2023 06:33 PM   Modules accepted: Orders

## 2023-04-11 ENCOUNTER — Other Ambulatory Visit: Payer: Self-pay | Admitting: Family Medicine

## 2023-04-15 ENCOUNTER — Other Ambulatory Visit: Payer: BC Managed Care – PPO

## 2023-04-15 ENCOUNTER — Ambulatory Visit (INDEPENDENT_AMBULATORY_CARE_PROVIDER_SITE_OTHER): Payer: BC Managed Care – PPO | Admitting: Obstetrics and Gynecology

## 2023-04-15 ENCOUNTER — Other Ambulatory Visit: Payer: Self-pay | Admitting: *Deleted

## 2023-04-15 VITALS — BP 136/82 | HR 94 | Wt 174.2 lb

## 2023-04-15 DIAGNOSIS — O365920 Maternal care for other known or suspected poor fetal growth, second trimester, not applicable or unspecified: Secondary | ICD-10-CM

## 2023-04-15 DIAGNOSIS — Z3A29 29 weeks gestation of pregnancy: Secondary | ICD-10-CM

## 2023-04-15 DIAGNOSIS — O402XX0 Polyhydramnios, second trimester, not applicable or unspecified: Secondary | ICD-10-CM

## 2023-04-15 DIAGNOSIS — O0990 Supervision of high risk pregnancy, unspecified, unspecified trimester: Secondary | ICD-10-CM

## 2023-04-15 DIAGNOSIS — O10019 Pre-existing essential hypertension complicating pregnancy, unspecified trimester: Secondary | ICD-10-CM

## 2023-04-15 DIAGNOSIS — O1213 Gestational proteinuria, third trimester: Secondary | ICD-10-CM

## 2023-04-15 MED ORDER — LABETALOL HCL 200 MG PO TABS: 200.0000 mg | ORAL_TABLET | Freq: Two times a day (BID) | ORAL | 3 refills | Status: DC

## 2023-04-15 NOTE — Progress Notes (Signed)
   PRENATAL VISIT NOTE  Subjective:  Janet Villanueva is a 27 y.o. G2P0010 at [redacted]w[redacted]d being seen today for ongoing prenatal care.  She is currently monitored for the following issues for this high-risk pregnancy and has Essential hypertension in pregnancy; Supervision of high risk pregnancy, antepartum; Proteinuria affecting pregnancy; Carrier of spinal muscular atrophy( Increased risk carrier); Poor fetal growth affecting management of mother in second trimester; and Polyhydramnios in second trimester on their problem list.  Patient reports no complaints.  Contractions: Irregular. Vag. Bleeding: None.  Movement: Present. Denies leaking of fluid.   The following portions of the patient's history were reviewed and updated as appropriate: allergies, current medications, past family history, past medical history, past social history, past surgical history and problem list.   Objective:   Vitals:   04/15/23 0849  BP: 136/82  Pulse: 94  Weight: 174 lb 3.2 oz (79 kg)    Fetal Status: Fetal Heart Rate (bpm): 141 Fundal Height: 28 cm Movement: Present     General:  Alert, oriented and cooperative. Patient is in no acute distress.  Skin: Skin is warm and dry. No rash noted.   Cardiovascular: Normal heart rate noted  Respiratory: Normal respiratory effort, no problems with respiration noted  Abdomen: Soft, gravid, appropriate for gestational age.  Pain/Pressure: Present     Pelvic: Cervical exam deferred        Extremities: Normal range of motion.  Edema: None  Mental Status: Normal mood and affect. Normal behavior. Normal judgment and thought content.   Assessment and Plan:  Pregnancy: G2P0010 at [redacted]w[redacted]d 1. Supervision of high risk pregnancy, antepartum (Primary) 28wk labs today  2. [redacted] weeks gestation of pregnancy  3. Essential hypertension in pregnancy Continue on labetalol 200 bid and low dose ASA   4. Polyhydramnios in second trimester, single or unspecified fetus See above.  Panorama LR  5. Poor fetal growth affecting management of mother in second trimester, single or unspecified fetus Has f/u scan tomorrow 11/19: efw 13%, 547g, ac 20%, afi 9.1   6. Proteinuria affecting pregnancy in third trimester Baseline 914 on PC ratio. Repeat postpartum  Preterm labor symptoms and general obstetric precautions including but not limited to vaginal bleeding, contractions, leaking of fluid and fetal movement were reviewed in detail with the patient. Please refer to After Visit Summary for other counseling recommendations.   Return in about 2 weeks (around 04/29/2023) for in person, low risk ob, md or app.  Future Appointments  Date Time Provider Department Center  04/16/2023  2:30 PM [facility] US5 [facility]  04/27/2023  3:30 PM Anyanwu, Jethro Bastos, MD [facility]  05/11/2023  4:10 PM Anyanwu, Jethro Bastos, MD [facility]    [place] Bing, MD

## 2023-04-15 NOTE — Progress Notes (Signed)
ROB: Feeling baby super low

## 2023-04-16 ENCOUNTER — Encounter: Payer: Self-pay | Admitting: Obstetrics and Gynecology

## 2023-04-16 ENCOUNTER — Other Ambulatory Visit: Payer: Self-pay | Admitting: *Deleted

## 2023-04-16 ENCOUNTER — Ambulatory Visit: Payer: BC Managed Care – PPO | Attending: Obstetrics and Gynecology

## 2023-04-16 DIAGNOSIS — O10013 Pre-existing essential hypertension complicating pregnancy, third trimester: Secondary | ICD-10-CM

## 2023-04-16 DIAGNOSIS — O99013 Anemia complicating pregnancy, third trimester: Secondary | ICD-10-CM | POA: Diagnosis not present

## 2023-04-16 DIAGNOSIS — O10913 Unspecified pre-existing hypertension complicating pregnancy, third trimester: Secondary | ICD-10-CM

## 2023-04-16 DIAGNOSIS — Z148 Genetic carrier of other disease: Secondary | ICD-10-CM | POA: Diagnosis not present

## 2023-04-16 DIAGNOSIS — O403XX0 Polyhydramnios, third trimester, not applicable or unspecified: Secondary | ICD-10-CM | POA: Diagnosis not present

## 2023-04-16 DIAGNOSIS — O10912 Unspecified pre-existing hypertension complicating pregnancy, second trimester: Secondary | ICD-10-CM | POA: Insufficient documentation

## 2023-04-16 DIAGNOSIS — D509 Iron deficiency anemia, unspecified: Secondary | ICD-10-CM | POA: Insufficient documentation

## 2023-04-16 DIAGNOSIS — D649 Anemia, unspecified: Secondary | ICD-10-CM

## 2023-04-16 DIAGNOSIS — Z3A29 29 weeks gestation of pregnancy: Secondary | ICD-10-CM

## 2023-04-16 DIAGNOSIS — O99019 Anemia complicating pregnancy, unspecified trimester: Secondary | ICD-10-CM | POA: Insufficient documentation

## 2023-04-16 DIAGNOSIS — O409XX0 Polyhydramnios, unspecified trimester, not applicable or unspecified: Secondary | ICD-10-CM

## 2023-04-16 LAB — GLUCOSE TOLERANCE, 2 HOURS W/ 1HR
Glucose, 1 hour: 137 mg/dL (ref 70–179)
Glucose, 2 hour: 100 mg/dL (ref 70–152)
Glucose, Fasting: 75 mg/dL (ref 70–91)

## 2023-04-16 LAB — CBC
Hematocrit: 32.1 % — ABNORMAL LOW (ref 34.0–46.6)
Hemoglobin: 9.9 g/dL — ABNORMAL LOW (ref 11.1–15.9)
MCH: 26.4 pg — ABNORMAL LOW (ref 26.6–33.0)
MCHC: 30.8 g/dL — ABNORMAL LOW (ref 31.5–35.7)
MCV: 86 fL (ref 79–97)
Platelets: 348 10*3/uL (ref 150–450)
RBC: 3.75 x10E6/uL — ABNORMAL LOW (ref 3.77–5.28)
RDW: 13.1 % (ref 11.7–15.4)
WBC: 7.1 10*3/uL (ref 3.4–10.8)

## 2023-04-16 LAB — RPR: RPR Ser Ql: NONREACTIVE

## 2023-04-16 LAB — HIV ANTIBODY (ROUTINE TESTING W REFLEX): HIV Screen 4th Generation wRfx: NONREACTIVE

## 2023-04-20 LAB — FERRITIN: Ferritin: 8 ng/mL — ABNORMAL LOW (ref 15–150)

## 2023-04-20 LAB — IRON AND TIBC: Iron: 38 ug/dL (ref 27–159)

## 2023-04-20 LAB — B12 AND FOLATE PANEL
Folate: 9.5 ng/mL (ref >3.0)
Vitamin B-12: 246 pg/mL (ref 232–1245)

## 2023-04-20 LAB — SPECIMEN STATUS REPORT

## 2023-04-21 NOTE — L&D Delivery Note (Signed)
 Obstetrical Delivery Note   Date of Delivery:   06/14/2023 Primary OB:   [facility] Gestational Age/EDD: [redacted]w[redacted]d Reason for Admission: IOL for FGR Antepartum complications: CHTN. Baseline proteinuria.  Delivered By:   [place] Bing, Montez Hageman MD  Delivery Type:   vacuum, low  Delivery Details:   Called to see patient due to prolonged fetal bradycardia and heavy vaginal bleeding after epidural placement. SVE showed dark red clots and patient complete and +2. With pushing patient to +3. When I arrived, I confirmed this and baby direct OA. Verbal consent for vacuum d/w patient and she was amenable to this. Kiwi placed at flexion point and with next pushing, patient easily delivered baby. Baby was vigorous and delayed cord clamping done. NICU present at delivery Dr. Algernon Huxley Anesthesia:    epidural Intrapartum complications: Abruptio Placenta GBS:    Positive status post two doses of PCN Laceration:    Cervical, posterior repaired with 3-0 vicryl and 1st degree perineal repaired with 2-0 vicryl Episiotomy:    none Rectal exam:   deferred Placenta:    Delivered and expressed via active management. Intact: yes. To pathology: no.  Delayed Cord Clamping: yes Estimated Blood Loss:   Baby:    Liveborn female, APGARs 7/8, weight 2620gm  Cornelia Copa. MD Attending Center for [employer] [facility])

## 2023-04-23 ENCOUNTER — Encounter: Payer: Self-pay | Admitting: Obstetrics and Gynecology

## 2023-04-23 ENCOUNTER — Other Ambulatory Visit: Payer: Self-pay

## 2023-04-23 ENCOUNTER — Observation Stay (HOSPITAL_COMMUNITY)
Admission: AD | Admit: 2023-04-23 | Discharge: 2023-04-24 | Disposition: A | Discharge status: Home / Self Care | Payer: BC Managed Care – PPO | Attending: Obstetrics & Gynecology | Admitting: Obstetrics & Gynecology

## 2023-04-23 ENCOUNTER — Other Ambulatory Visit: Payer: Self-pay | Admitting: Obstetrics and Gynecology

## 2023-04-23 ENCOUNTER — Encounter (HOSPITAL_COMMUNITY): Payer: Self-pay | Admitting: Obstetrics & Gynecology

## 2023-04-23 DIAGNOSIS — O10913 Unspecified pre-existing hypertension complicating pregnancy, third trimester: Secondary | ICD-10-CM | POA: Diagnosis not present

## 2023-04-23 DIAGNOSIS — Z7982 Long term (current) use of aspirin: Secondary | ICD-10-CM | POA: Diagnosis not present

## 2023-04-23 DIAGNOSIS — O99013 Anemia complicating pregnancy, third trimester: Secondary | ICD-10-CM | POA: Diagnosis not present

## 2023-04-23 DIAGNOSIS — D649 Anemia, unspecified: Secondary | ICD-10-CM | POA: Diagnosis not present

## 2023-04-23 DIAGNOSIS — R55 Syncope and collapse: Secondary | ICD-10-CM | POA: Insufficient documentation

## 2023-04-23 DIAGNOSIS — Z79899 Other long term (current) drug therapy: Secondary | ICD-10-CM | POA: Insufficient documentation

## 2023-04-23 DIAGNOSIS — Z23 Encounter for immunization: Secondary | ICD-10-CM | POA: Insufficient documentation

## 2023-04-23 DIAGNOSIS — Z3A30 30 weeks gestation of pregnancy: Secondary | ICD-10-CM | POA: Diagnosis not present

## 2023-04-23 DIAGNOSIS — O26813 Pregnancy related exhaustion and fatigue, third trimester: Secondary | ICD-10-CM | POA: Diagnosis present

## 2023-04-23 LAB — CBC WITH DIFFERENTIAL/PLATELET
Abs Immature Granulocytes: 0.33 10*3/uL — ABNORMAL HIGH (ref 0.00–0.07)
Basophils Absolute: 0 10*3/uL (ref 0.0–0.1)
Basophils Relative: 1 %
Eosinophils Absolute: 0.1 10*3/uL (ref 0.0–0.5)
Eosinophils Relative: 1 %
HCT: 29.1 % — ABNORMAL LOW (ref 36.0–46.0)
Hemoglobin: 9.1 g/dL — ABNORMAL LOW (ref 12.0–15.0)
Immature Granulocytes: 4 %
Lymphocytes Relative: 18 %
Lymphs Abs: 1.6 10*3/uL (ref 0.7–4.0)
MCH: 25.9 pg — ABNORMAL LOW (ref 26.0–34.0)
MCHC: 31.3 g/dL (ref 30.0–36.0)
MCV: 82.9 fL (ref 80.0–100.0)
Monocytes Absolute: 1.4 10*3/uL — ABNORMAL HIGH (ref 0.1–1.0)
Monocytes Relative: 16 %
Neutro Abs: 5.2 10*3/uL (ref 1.7–7.7)
Neutrophils Relative %: 60 %
Platelets: 332 10*3/uL (ref 150–400)
RBC: 3.51 MIL/uL — ABNORMAL LOW (ref 3.87–5.11)
RDW: 14.6 % (ref 11.5–15.5)
WBC: 8.7 10*3/uL (ref 4.0–10.5)
nRBC: 0 % (ref 0.0–0.2)

## 2023-04-23 LAB — URINALYSIS, ROUTINE W REFLEX MICROSCOPIC
Bilirubin Urine: NEGATIVE
Glucose, UA: NEGATIVE mg/dL
Hgb urine dipstick: NEGATIVE
Ketones, ur: NEGATIVE mg/dL
Leukocytes,Ua: NEGATIVE
Nitrite: NEGATIVE
Protein, ur: NEGATIVE mg/dL
Specific Gravity, Urine: 1.001 — ABNORMAL LOW (ref 1.005–1.030)
pH: 7 (ref 5.0–8.0)

## 2023-04-23 LAB — TYPE AND SCREEN
ABO/RH(D): B POS
Antibody Screen: NEGATIVE

## 2023-04-23 LAB — COMPREHENSIVE METABOLIC PANEL
ALT: 29 U/L (ref 0–44)
AST: 29 U/L (ref 15–41)
Albumin: 2.7 g/dL — ABNORMAL LOW (ref 3.5–5.0)
Alkaline Phosphatase: 86 U/L (ref 38–126)
Anion gap: 8 (ref 5–15)
BUN: 11 mg/dL (ref 6–20)
CO2: 22 mmol/L (ref 22–32)
Calcium: 9.8 mg/dL (ref 8.9–10.3)
Chloride: 105 mmol/L (ref 98–111)
Creatinine, Ser: 0.86 mg/dL (ref 0.44–1.00)
GFR, Estimated: >60 mL/min (ref >60)
Glucose, Bld: 78 mg/dL (ref 70–99)
Potassium: 4.7 mmol/L (ref 3.5–5.1)
Sodium: 135 mmol/L (ref 135–145)
Total Bilirubin: 0.8 mg/dL (ref 0.0–1.2)
Total Protein: 7 g/dL (ref 6.5–8.1)

## 2023-04-23 MED ORDER — SODIUM CHLORIDE 0.9% FLUSH
3.0000 mL | Freq: Two times a day (BID) | INTRAVENOUS | Status: DC
  Administered 2023-04-23: 10 mL via INTRAVENOUS

## 2023-04-23 MED ORDER — INFLUENZA VIRUS VACC SPLIT PF (FLUZONE) 0.5 ML IM SUSY
0.5000 mL | PREFILLED_SYRINGE | INTRAMUSCULAR | Status: AC
  Administered 2023-04-24: 0.5 mL via INTRAMUSCULAR
  Filled 2023-04-23: qty 0.5

## 2023-04-23 MED ORDER — LABETALOL HCL 200 MG PO TABS
200.0000 mg | ORAL_TABLET | Freq: Two times a day (BID) | ORAL | Status: DC
  Administered 2023-04-23 – 2023-04-24 (×2): 200 mg via ORAL
  Filled 2023-04-23 (×2): qty 1

## 2023-04-23 MED ORDER — SODIUM CHLORIDE 0.9 % IV SOLN
500.0000 mg | Freq: Once | INTRAVENOUS | Status: AC
  Administered 2023-04-23: 500 mg via INTRAVENOUS
  Filled 2023-04-23 (×2): qty 25

## 2023-04-23 MED ORDER — ACETAMINOPHEN 325 MG PO TABS: 650.0000 mg | ORAL_TABLET | ORAL | Status: DC | PRN

## 2023-04-23 MED ORDER — FAMOTIDINE 20 MG PO TABS
20.0000 mg | ORAL_TABLET | Freq: Two times a day (BID) | ORAL | Status: DC
Start: 2023-04-23 — End: 2023-04-24
  Administered 2023-04-23 – 2023-04-24 (×2): 20 mg via ORAL
  Filled 2023-04-23 (×2): qty 1

## 2023-04-23 MED ORDER — CALCIUM CARBONATE ANTACID 500 MG PO CHEW: 2.0000 | CHEWABLE_TABLET | ORAL | Status: DC | PRN

## 2023-04-23 MED ORDER — ASPIRIN 81 MG PO CHEW
162.0000 mg | CHEWABLE_TABLET | Freq: Every day | ORAL | Status: DC
  Administered 2023-04-23 – 2023-04-24 (×2): 162 mg via ORAL
  Filled 2023-04-23 (×2): qty 2

## 2023-04-23 MED ORDER — SODIUM CHLORIDE 0.9% FLUSH: 3.0000 mL | INTRAVENOUS | Status: DC | PRN

## 2023-04-23 MED ORDER — PRENATAL MULTIVITAMIN CH
1.0000 | ORAL_TABLET | Freq: Every day | ORAL | Status: DC
  Administered 2023-04-24: 1 via ORAL
  Filled 2023-04-23: qty 1

## 2023-04-23 MED ORDER — DOCUSATE SODIUM 100 MG PO CAPS
100.0000 mg | ORAL_CAPSULE | Freq: Every day | ORAL | Status: DC
Start: 2023-04-23 — End: 2023-04-24
  Administered 2023-04-23 – 2023-04-24 (×2): 100 mg via ORAL
  Filled 2023-04-23 (×2): qty 1

## 2023-04-23 NOTE — H&P (Signed)
 Chief Complaint:  Loss of Consciousness  HPI   Event Date/Time   First Provider Initiated Contact with Patient 04/23/23 1023     Janet Villanueva is a 28 y.o. G2P0010 at [redacted]w[redacted]d who presents to maternity admissions reporting a syncopal episode shortly after arriving to work this morning. She was leaned against a wall participating the morning huddle meeting when she started to feel off and experience tunnel vision. She went down but co-workers were able to lower her and avoid hitting her head or belly. She had taken her morning dose of 200mg  labetalol  around 7am, eaten breakfast and drinks water regularly. She does not think her knees were locked. Had previously passed her GTT, but does have anemia. Feeling regular fetal movement and denies vaginal bleeding, LOF, cramping or any other physical complaints.  Pregnancy Course: Receives care at [facility], works at [employer]. Prenatal records reviewed. Has known cHTN treated with labetalol  since earlier in her pregnancy with no recent titrations. Also has polyhydramnios (AFI 25 on last ultrasound), but growth appropriate.  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   FOB screening offered    Hypertension    IBS (irritable bowel syndrome) 05/20/2015   Seasonal allergic rhinitis 05/30/2021   OB History  Gravida Para Term Preterm AB Living  2 0 0 0 1 0  SAB IAB Ectopic Multiple Live Births  1 0 0 0     # Outcome Date GA Lbr Len/2nd Weight Sex Type Anes PTL Lv  2 Current           1 SAB 2021           Past Surgical History:  Procedure Laterality Date   LAPAROSCOPIC OVARIAN CYSTECTOMY Left 12/11/2022   Procedure: LAPAROSCOPIC OVARIAN CYSTECTOMY AND DETORSION;  Surgeon: Verdon Keen, MD;  Location: [facility];  Service: Gynecology;  Laterality: Left;   LAPAROSCOPY Left 12/11/2022   Procedure: LAPAROSCOPY DIAGNOSTIC;  Surgeon: Verdon Keen, MD;  Location: [facility];  Service:  Gynecology;  Laterality: Left;   TONSILLECTOMY     TONSILLECTOMY     Family History  Problem Relation Age of Onset   Cancer Maternal Grandmother        breast   Diabetes Maternal Grandmother    Hypertension Maternal Grandfather    Thyroid disease Mother    Asthma Sister    Asthma Brother    Social History   Tobacco Use   Smoking status: Never   Smokeless tobacco: Never  Vaping Use   Vaping status: Never Used  Substance Use Topics   Alcohol use: Not Currently    Comment: social   Drug use: No   No Known Allergies Medications Prior to Admission  Medication Sig Dispense Refill Last Dose/Taking   aspirin  81 MG chewable tablet Chew 2 tablets (162 mg total) by mouth daily. 180 tablet 3 04/22/2023   famotidine  (PEPCID ) 20 MG tablet Take 1 tablet (20 mg total) by mouth 2 (two) times daily. 60 tablet 3 04/23/2023 Morning   labetalol  (NORMODYNE ) 200 MG tablet Take 1 tablet (200 mg total) by mouth 2 (two) times daily. 60 tablet 3 04/23/2023 at  7:00 AM   Prenatal Vit-Fe Fumarate-FA (PRENATAL MULTIVITAMIN) TABS tablet Take 1 tablet by mouth daily at 12 noon.   04/22/2023   metroNIDAZOLE  (FLAGYL ) 500 MG tablet Take 1 tablet (500 mg total) by mouth 2 (two) times daily. (Patient not taking: Reported on 04/15/2023) 14 tablet 0  I have reviewed patient's Past Medical Hx, Surgical Hx, Family Hx, Social Hx, medications and allergies.   ROS  Pertinent items noted in HPI and remainder of comprehensive ROS otherwise negative.   PHYSICAL EXAM  Patient Vitals for the past 24 hrs:  BP Temp Temp src Pulse Resp SpO2 Height Weight  04/23/23 1019 126/74 -- -- 92 -- -- -- --  04/23/23 0959 134/73 98.1 F (36.7 C) Oral 88 16 100 % 5' 5 (1.651 m) 175 lb 4.8 oz (79.5 kg)  04/23/23 0957 -- -- -- -- -- 100 % -- --   Constitutional: Well-developed, well-nourished female in no acute distress.  Cardiovascular: normal rate & rhythm, no murmur noted, warm and well-perfused Respiratory: normal effort, no  problems with respiration noted GI: Abd soft, non-tender, non-distended MS: Extremities nontender, no edema, normal ROM Neurologic: Alert and oriented x 4.  GU: no CVA tenderness Pelvic: exam deferred  Fetal Tracing: reactive for gestational age Baseline: 140 Variability: minimal but appropriate for gestational age Accelerations: 10x10 Decelerations: none Toco: relaxed with mild UI (not felt by pt)   Labs: Results for orders placed or performed during the hospital encounter of 04/23/23 (from the past 24 hours)  Urinalysis, Routine w reflex microscopic -Urine, Clean Catch     Status: Abnormal   Collection Time: 04/23/23 10:40 AM  Result Value Ref Range   Color, Urine COLORLESS (A) YELLOW   APPearance CLEAR CLEAR   Specific Gravity, Urine 1.001 (L) 1.005 - 1.030   pH 7.0 5.0 - 8.0   Glucose, UA NEGATIVE NEGATIVE mg/dL   Hgb urine dipstick NEGATIVE NEGATIVE   Bilirubin Urine NEGATIVE NEGATIVE   Ketones, ur NEGATIVE NEGATIVE mg/dL   Protein, ur NEGATIVE NEGATIVE mg/dL   Nitrite NEGATIVE NEGATIVE   Leukocytes,Ua NEGATIVE NEGATIVE  CBC with Differential/Platelet     Status: Abnormal   Collection Time: 04/23/23 10:40 AM  Result Value Ref Range   WBC 8.7 4.0 - 10.5 K/uL   RBC 3.51 (L) 3.87 - 5.11 MIL/uL   Hemoglobin 9.1 (L) 12.0 - 15.0 g/dL   HCT 70.8 (L) 63.9 - 53.9 %   MCV 82.9 80.0 - 100.0 fL   MCH 25.9 (L) 26.0 - 34.0 pg   MCHC 31.3 30.0 - 36.0 g/dL   RDW 85.3 88.4 - 84.4 %   Platelets 332 150 - 400 K/uL   nRBC 0.0 0.0 - 0.2 %   Neutrophils Relative % 60 %   Neutro Abs 5.2 1.7 - 7.7 K/uL   Lymphocytes Relative 18 %   Lymphs Abs 1.6 0.7 - 4.0 K/uL   Monocytes Relative 16 %   Monocytes Absolute 1.4 (H) 0.1 - 1.0 K/uL   Eosinophils Relative 1 %   Eosinophils Absolute 0.1 0.0 - 0.5 K/uL   Basophils Relative 1 %   Basophils Absolute 0.0 0.0 - 0.1 K/uL   Immature Granulocytes 4 %   Abs Immature Granulocytes 0.33 (H) 0.00 - 0.07 K/uL  Comprehensive metabolic panel      Status: Abnormal   Collection Time: 04/23/23 10:40 AM  Result Value Ref Range   Sodium 135 135 - 145 mmol/L   Potassium 4.7 3.5 - 5.1 mmol/L   Chloride 105 98 - 111 mmol/L   CO2 22 22 - 32 mmol/L   Glucose, Bld 78 70 - 99 mg/dL   BUN 11 6 - 20 mg/dL   Creatinine, Ser 9.13 0.44 - 1.00 mg/dL   Calcium  9.8 8.9 - 10.3 mg/dL   Total Protein 7.0  6.5 - 8.1 g/dL   Albumin 2.7 (L) 3.5 - 5.0 g/dL   AST 29 15 - 41 U/L   ALT 29 0 - 44 U/L   Alkaline Phosphatase 86 38 - 126 U/L   Total Bilirubin 0.8 0.0 - 1.2 mg/dL   GFR, Estimated >39 >39 mL/min   Anion gap 8 5 - 15   Imaging:  No results found.  MDM & MAU COURSE  MDM: Moderate  MAU Course: Orders Placed This Encounter  Procedures   Urinalysis, Routine w reflex microscopic -Urine, Clean Catch   CBC with Differential/Platelet   Comprehensive metabolic panel   Orthostatic vital signs   EKG 12-Lead   No orders of the defined types were placed in this encounter.  Feeling well on arrival to MAU with MGM at bedside for support. Labs and EKG/orthostatics ordered.   EKG and orthostatics normal, but hemoglobin worse at 9.1. Pt recalls feeling more tired than usual with mood changes. Given syncopal episode, appropriate for immediate Venofer  infusion but due to MAU acuity, will admit to antenatal for observation and infusion. Cleared with Dr. Eveline and orders placed.  ASSESSMENT  Symptomatic anemia at [redacted]w[redacted]d   PLAN  Admit to antenatal for Venofer  infusion Care turned over to Dr. Eveline Cornell Finder, CNM, MSN, [facility] Certified Nurse Midwife, [facility]

## 2023-04-23 NOTE — MAU Note (Signed)
 Janet Villanueva is a 28 y.o. at [redacted]w[redacted]d here in MAU reporting: works at [employer].  Out of nowhere, got tunnel vision and Passed out at work. They were able to get her down safely, did not hit her head or anything.  After the event, her pulse was still elevated and the dr there said she should come in and get a BMP and an EKG. Had eaten breakfast and taken BP medication.  Doesn't know what happened. Feels ok now.  No bleeding or leaking. Baby has been moving this morning. Onset of complaint: ~0810 Pain score: sharp, dull pain in her pelvis Vitals:   04/23/23 0957 04/23/23 0959  BP:  134/73  Pulse:  88  Resp:  16  Temp:  98.1 F (36.7 C)  SpO2: 100% 100%     FHT:148 Lab orders placed from triage:  UA

## 2023-04-24 DIAGNOSIS — Z3A30 30 weeks gestation of pregnancy: Secondary | ICD-10-CM

## 2023-04-24 DIAGNOSIS — O26893 Other specified pregnancy related conditions, third trimester: Secondary | ICD-10-CM

## 2023-04-24 DIAGNOSIS — D649 Anemia, unspecified: Secondary | ICD-10-CM | POA: Diagnosis not present

## 2023-04-24 DIAGNOSIS — O99013 Anemia complicating pregnancy, third trimester: Secondary | ICD-10-CM | POA: Diagnosis not present

## 2023-04-24 NOTE — Discharge Summary (Signed)
 Antenatal Physician Discharge Summary  Patient ID: Dung Prien MRN: [id number] DOB/AGE: 1995-09-10 28 y.o.  Admit date: 04/23/2023 Discharge date: 04/24/2023  Admission Diagnoses:  Discharge Diagnoses: Principal Problem:   Symptomatic anemia  Syncope [redacted] weeks gestation   Prenatal Procedures: IV Venofer   Consults: None  Hospital Course:  Shanora Christensen is a 28 y.o. G2P0010 with IUP at [redacted]w[redacted]d admitted for syncope, dropping Hgb and IV iron .  She was admitted with syncope and noted to have decreasing Hgb. She was observed, Given IV Venofer . fetal heart rate monitoring remained reassuring, and she had no signs/symptoms of progressing  maternal-fetal concerns.  Her cervical exam was unchanged from admission.  She was deemed stable for discharge to home with outpatient follow up.  Discharge Exam: Temp:  [97.8 F (36.6 C)-98.5 F (36.9 C)] 98.4 F (36.9 C) (01/04 0627) Pulse Rate:  [84-93] 89 (01/04 0627) Resp:  [16-17] 17 (01/04 0627) BP: (111-134)/(57-78) 114/57 (01/04 0627) SpO2:  [99 %-100 %] 99 % (01/04 0627) Weight:  [79.5 kg] 79.5 kg (01/03 0959) Physical Examination: CONSTITUTIONAL: Well-developed, well-nourished female in no acute distress.  HENT:  Normocephalic, atraumatic, External right and left ear normal.  EYES: Conjunctivae and EOM are normal. Pupils are equal, round, and reactive to light. No scleral icterus.  NECK: Normal range of motion, supple, no masses SKIN: Skin is warm and dry. No rash noted. Not diaphoretic. No erythema. No pallor. NEUROLOGIC: Alert and oriented to person, place, and time. Normal reflexes, muscle tone coordination. No cranial nerve deficit noted. PSYCHIATRIC: Normal mood and affect. Normal behavior. Normal judgment and thought content. CARDIOVASCULAR: Normal heart rate noted, regular rhythm RESPIRATORY: Effort and breath sounds normal, no problems with respiration noted MUSCULOSKELETAL: Normal range of motion. No edema and no  tenderness. 2+ distal pulses. ABDOMEN: Soft, nontender, nondistended, gravid. CERVIX:    Fetal monitoring: FHR: 140 bpm, Variability: moderate, Accelerations: Present, Decelerations: Absent  Uterine activity: quiet  Significant Diagnostic Studies:  Results for orders placed or performed during the hospital encounter of 04/23/23 (from the past week)  Urinalysis, Routine w reflex microscopic -Urine, Clean Catch   Collection Time: 04/23/23 10:40 AM  Result Value Ref Range   Color, Urine COLORLESS (A) YELLOW   APPearance CLEAR CLEAR   Specific Gravity, Urine 1.001 (L) 1.005 - 1.030   pH 7.0 5.0 - 8.0   Glucose, UA NEGATIVE NEGATIVE mg/dL   Hgb urine dipstick NEGATIVE NEGATIVE   Bilirubin Urine NEGATIVE NEGATIVE   Ketones, ur NEGATIVE NEGATIVE mg/dL   Protein, ur NEGATIVE NEGATIVE mg/dL   Nitrite NEGATIVE NEGATIVE   Leukocytes,Ua NEGATIVE NEGATIVE  CBC with Differential/Platelet   Collection Time: 04/23/23 10:40 AM  Result Value Ref Range   WBC 8.7 4.0 - 10.5 K/uL   RBC 3.51 (L) 3.87 - 5.11 MIL/uL   Hemoglobin 9.1 (L) 12.0 - 15.0 g/dL   HCT 70.8 (L) 63.9 - 53.9 %   MCV 82.9 80.0 - 100.0 fL   MCH 25.9 (L) 26.0 - 34.0 pg   MCHC 31.3 30.0 - 36.0 g/dL   RDW 85.3 88.4 - 84.4 %   Platelets 332 150 - 400 K/uL   nRBC 0.0 0.0 - 0.2 %   Neutrophils Relative % 60 %   Neutro Abs 5.2 1.7 - 7.7 K/uL   Lymphocytes Relative 18 %   Lymphs Abs 1.6 0.7 - 4.0 K/uL   Monocytes Relative 16 %   Monocytes Absolute 1.4 (H) 0.1 - 1.0 K/uL   Eosinophils Relative 1 %  Eosinophils Absolute 0.1 0.0 - 0.5 K/uL   Basophils Relative 1 %   Basophils Absolute 0.0 0.0 - 0.1 K/uL   Immature Granulocytes 4 %   Abs Immature Granulocytes 0.33 (H) 0.00 - 0.07 K/uL  Comprehensive metabolic panel   Collection Time: 04/23/23 10:40 AM  Result Value Ref Range   Sodium 135 135 - 145 mmol/L   Potassium 4.7 3.5 - 5.1 mmol/L   Chloride 105 98 - 111 mmol/L   CO2 22 22 - 32 mmol/L   Glucose, Bld 78 70 - 99 mg/dL    BUN 11 6 - 20 mg/dL   Creatinine, Ser 9.13 0.44 - 1.00 mg/dL   Calcium  9.8 8.9 - 10.3 mg/dL   Total Protein 7.0 6.5 - 8.1 g/dL   Albumin 2.7 (L) 3.5 - 5.0 g/dL   AST 29 15 - 41 U/L   ALT 29 0 - 44 U/L   Alkaline Phosphatase 86 38 - 126 U/L   Total Bilirubin 0.8 0.0 - 1.2 mg/dL   GFR, Estimated >39 >39 mL/min   Anion gap 8 5 - 15  Type and screen [facility]   Collection Time: 04/23/23 12:55 PM  Result Value Ref Range   ABO/RH(D) B POS    Antibody Screen NEG    Sample Expiration      04/26/2023,2359 Performed at [facility], [address] [place], [place] [address]    US  MFM OB FOLLOW UP Result Date: 04/16/2023 ----------------------------------------------------------------------  OBSTETRICS REPORT                       (Signed Final 04/16/2023 03:34 pm) ---------------------------------------------------------------------- Patient Info  ID #:       [id number]                          D.O.B.:  April 23, 1995 (28 yrs)(F)  Name:       BARUCH PONT                  Visit Date: 04/16/2023 02:34 pm              Hassan ---------------------------------------------------------------------- Performed By  Attending:        Steffan Keys MD         Ref. Address:     [address]                                                             [address]  Performed By:     Cassondra FORBES Jefferson BS,      [facility]  Referred By:      [facility] Correne Beagle ---------------------------------------------------------------------- Orders  #  Description                           Code        Ordered By  1  US  MFM OB FOLLOW UP                   23183.98    FREDIA FRESH ----------------------------------------------------------------------  #  Order #                      Accession #                Episode #  1  [id number]                   [id number]                 [id number] ---------------------------------------------------------------------- Indications  Hypertension - Chronic/Pre-existing            O10.019  (labetalol )  Polyhydramnios, third trimester, antepartum    O40.3XX0  condition or complication, unspecified fetus  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 Cord Insertion:      Previously seen  Amniotic Fluid  AFI FV:      Polyhydramnios  AFI Sum(cm)     %Tile       Largest Pocket(cm)  25.12           97          7.1  RUQ(cm)       RLQ(cm)       LUQ(cm)        LLQ(cm)  7.1           6.04          6.73           5.25 ---------------------------------------------------------------------- Biometry  BPD:      72.2  mm     G. Age:  29w 0d         32  %    CI:        77.09   %    70 - 86  FL/HC:      20.8   %    19.6 - 20.8  HC:      260.4  mm     G. Age:  28w 2d          5  %    HC/AC:      1.09        0.99 - 1.21  AC:      239.7  mm     G. Age:  28w 2d         19  %    FL/BPD:     75.1   %    71 - 87  FL:       54.2  mm     G. Age:  28w 5d         23  %    FL/AC:      22.6   %    20 - 24  Est. FW:    1230  gm    2 lb 11 oz      17  % ---------------------------------------------------------------------- OB History  Gravidity:    2         Term:   0        Prem:   0        SAB:   1  TOP:          0       Ectopic:  0        Living: 0  ---------------------------------------------------------------------- Gestational Age  LMP:           29w 1d        Date:  09/24/22                   EDD:   07/01/23  U/S Today:     28w 4d                                        EDD:   07/05/23  Best:          29w 1d     Det. By:  LMP  (09/24/22)          EDD:   07/01/23 ---------------------------------------------------------------------- Targeted Anatomy  Central Nervous System  Calvarium/Cranial V.:  Appears normal         Cereb./Vermis:          Appears normal  Cavum:                 Appears normal         Cisterna Magna:         Previously seen  Lateral Ventricles:    Appears normal         Midline Falx:           Previously seen  Choroid Plexus:        Previously seen  Spine  Cervical:              Previously seen        Sacral:                 Previously seen  Thoracic:              Previously seen        Shape/Curvature:        Previously seen  Lumbar:  Previously seen  Head/Neck  Lips:                  Previously seen        Orbits/Eyes:            Previously seen  Nuchal Fold:           Not applicable         Mandible:               Previously seen  Nasal Bone:            Previously seen        Maxilla:                Previously seen  Profile:               Previously seen  Thorax  4 Chamber View:        Appears normal         Interventr. Septum:     Previously seen  Cardiac Activity:      Observed               Cardiac Axis:           Previously seen  Cardiac Situs:         Previously seen        Diaphragm:              Previously seen  Rt Outflow Tract:      Previously seen        3 Vessel View:          Previously seen  Lt Outflow Tract:      Previously seen        3 V Trachea View:       Previously seen  Aortic Arch:           Previously seen        IVC:                    Previously seen  Ductal Arch:           Previously seen        Crossing:               Previously seen  SVC:                   Previously seen  Abdomen  Cord Insertion:         Previously seen        Lt Kidney:              Appears normal  Situs:                 Appears normal         Rt Kidney:              Appears normal  Stomach:               Appears normal         Bladder:                Appears normal  Extremities  Lt Humerus:            Previously seen        Lt Femur:               Previously seen  Rt Humerus:  Previously seen        Rt Femur:               Previously seen  Lt Forearm:            Previously seen        Lt Lower Leg:           Previously seen  Rt Forearm:            Previously seen        Rt Lower Leg:           Previously seen  Lt Hand:               Previously seen        Lt Foot:                Previously seen  Rt Hand:               Previously seen        Rt Foot:                Previously seen  Other  Umbilical Cord:        Previously seen        Genitalia:              Previously seen  Comment:     Fetal anatomic survey complete. ---------------------------------------------------------------------- Cervix Uterus Adnexa  Cervix  Not visualized (advanced GA >24wks) ---------------------------------------------------------------------- Comments  This patient was seen for a follow up growth scan due to  chronic hypertension treated with labetalol .  She denies any  problems since her last exam and has screened negative for  gestational diabetes.  She was informed that the fetal growth appears appropriate  for her gestational age.  Mild polyhydramnios with a total AFI  of 25.1 cm was noted today.  Due to chronic hypertension, we will start weekly fetal testing  at 32 weeks.  She will return in 3 weeks for a growth scan and BPP. ----------------------------------------------------------------------                   Steffan Keys, MD Electronically Signed Final Report   04/16/2023 03:34 pm ----------------------------------------------------------------------    Future Appointments  Date Time Provider Department Center  04/27/2023  3:30 PM Herchel Gloris LABOR, MD [facility]  05/07/2023  3:15 PM [facility] NURSE [facility]  05/07/2023  3:30 PM [facility] US2 [facility]  05/11/2023  4:10 PM Anyanwu, Gloris LABOR, MD [facility]  05/14/2023  3:15 PM [facility] NURSE [facility]  05/14/2023  3:30 PM [facility] US1 [facility]  05/21/2023  3:15 PM [facility] NURSE [facility]  05/21/2023  3:30 PM [facility] US6 [facility]  05/25/2023  4:10 PM Erik Kieth BROCKS, MD [facility]  06/01/2023  4:10 PM Anyanwu, Gloris LABOR, MD [facility]  06/08/2023  4:10 PM Izell Harari, MD [facility]  06/15/2023  4:10 PM Anyanwu, Gloris LABOR, MD [facility]    Discharge Condition: Stable  Discharge disposition: 01-Home or Self Care       Discharge Instructions     Discharge activity:  No Restrictions   Complete by: As directed    Discharge diet:  No restrictions   Complete by: As directed    Fetal Kick Count:  Lie on our left side for one hour after a meal, and count the number of times your baby kicks.  If it is less than 5 times, get up, move around  and drink some juice.  Repeat the test 30 minutes later.  If it is still less than 5 kicks in an hour, notify your doctor.   Complete by: As directed       Allergies as of 04/24/2023   No Known Allergies      Medication List     TAKE these medications    aspirin  81 MG chewable tablet Chew 2 tablets (162 mg total) by mouth daily.   famotidine  20 MG tablet Commonly known as: PEPCID  Take 1 tablet (20 mg total) by mouth 2 (two) times daily.   labetalol  200 MG tablet Commonly known as: NORMODYNE  Take 1 tablet (200 mg total) by mouth 2 (two) times daily.   metroNIDAZOLE  500 MG tablet Commonly known as: FLAGYL  Take 1 tablet (500 mg total) by mouth 2 (two) times daily.   prenatal multivitamin Tabs tablet Take 1 tablet by mouth daily at 12 noon.        Follow-up Information     [facility] Follow up in  3 day(s).   Specialty: Obstetrics and Gynecology Why: keep next scheduled appointment Contact information: [address] [place]  [phone]                Total discharge time: 20 minutes   Signed: Glenys GORMAN Birk M.D. 04/24/2023, 8:01 AM

## 2023-04-26 ENCOUNTER — Telehealth: Payer: Self-pay | Admitting: Pharmacy Technician

## 2023-04-26 NOTE — Telephone Encounter (Signed)
 Dr. Izell, [facility] note:  patient will be scheduled as soon as possible.  Auth Submission: NO AUTH NEEDED Site of care: Site of care: [facility] Payer: BCBC  Medication & CPT/J Code(s) submitted: Venofer  (Iron  Sucrose) J1756 Route of submission (phone, fax, portal):  Phone # Fax # Auth type: Buy/Bill PB Units/visits requested: 2 DOSES Reference number:  Approval from: 04/26/23 to 07/25/23

## 2023-04-27 ENCOUNTER — Ambulatory Visit (INDEPENDENT_AMBULATORY_CARE_PROVIDER_SITE_OTHER): Payer: Managed Care, Other (non HMO) | Admitting: Obstetrics & Gynecology

## 2023-04-27 VITALS — BP 134/73 | HR 97 | Wt 177.0 lb

## 2023-04-27 DIAGNOSIS — O10013 Pre-existing essential hypertension complicating pregnancy, third trimester: Secondary | ICD-10-CM

## 2023-04-27 DIAGNOSIS — D509 Iron deficiency anemia, unspecified: Secondary | ICD-10-CM

## 2023-04-27 DIAGNOSIS — O403XX0 Polyhydramnios, third trimester, not applicable or unspecified: Secondary | ICD-10-CM

## 2023-04-27 DIAGNOSIS — Z3A30 30 weeks gestation of pregnancy: Secondary | ICD-10-CM

## 2023-04-27 DIAGNOSIS — O0990 Supervision of high risk pregnancy, unspecified, unspecified trimester: Secondary | ICD-10-CM

## 2023-04-27 DIAGNOSIS — O10019 Pre-existing essential hypertension complicating pregnancy, unspecified trimester: Secondary | ICD-10-CM

## 2023-04-27 DIAGNOSIS — Z23 Encounter for immunization: Secondary | ICD-10-CM | POA: Diagnosis not present

## 2023-04-27 DIAGNOSIS — O99013 Anemia complicating pregnancy, third trimester: Secondary | ICD-10-CM

## 2023-04-27 NOTE — Progress Notes (Signed)
 PRENATAL VISIT NOTE  Subjective:  Janet Villanueva is a 28 y.o. G2P0010 at [redacted]w[redacted]d being seen today for ongoing prenatal care.  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  Patient reports  one episode of syncope at work recently, negative evaluation in ER afterwards. Feels fine now .  Contractions: Not present. Vag. Bleeding: None.  Movement: Present. Denies leaking of fluid.   The following portions of the patient's history were reviewed and updated as appropriate: allergies, current medications, past family history, past medical history, past social history, past surgical history and problem list.   Objective:   Vitals:   04/27/23 1531  BP: 134/73  Pulse: 97  Weight: 177 lb (80.3 kg)    Fetal Status: Fetal Heart Rate (bpm): 140   Movement: Present     General:  Alert, oriented and cooperative. Patient is in no acute distress.  Skin: Skin is warm and dry. No rash noted.   Cardiovascular: Normal heart rate noted  Respiratory: Normal respiratory effort, no problems with respiration noted  Abdomen: Soft, gravid, appropriate for gestational age.  Pain/Pressure: Absent     Pelvic: Cervical exam deferred        Extremities: Normal range of motion.  Edema: None  Mental Status: Normal mood and affect. Normal behavior. Normal judgment and thought content.   US  MFM OB FOLLOW UP Result Date: 04/16/2023 ----------------------------------------------------------------------  OBSTETRICS REPORT                       (Signed Final 04/16/2023 03:34 pm)  ---------------------------------------------------------------------- Patient Info  ID #:       [id number]                          D.O.B.:  Mar 09, 1996 (27 yrs)(F)  Name:       Janet Villanueva                  Visit Date: 04/16/2023 02:34 pm              Fryer ---------------------------------------------------------------------- Performed By  Attending:        Steffan Keys MD         Ref. Address:     [address]                                                             [address]  Performed By:     Cassondra FORBES Jefferson BS,      [facility]  Referred By:      [facility] Correne Beagle ---------------------------------------------------------------------- Orders  #  Description                           Code        Ordered By  1  US  MFM OB FOLLOW UP                   23183.98    FREDIA FRESH ----------------------------------------------------------------------  #  Order #                     Accession #                Episode #  1  [id number]                   [id number]                 [id number] ---------------------------------------------------------------------- Indications  Hypertension - Chronic/Pre-existing            O10.019  (labetalol )  Polyhydramnios, third trimester, antepartum    O40.3XX0  condition or complication, unspecified fetus  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 Cord Insertion:      Previously seen  Amniotic Fluid  AFI FV:      Polyhydramnios  AFI Sum(cm)     %Tile       Largest Pocket(cm)  25.12           97          7.1  RUQ(cm)       RLQ(cm)       LUQ(cm)        LLQ(cm)  7.1           6.04          6.73           5.25 ---------------------------------------------------------------------- Biometry  BPD:      72.2  mm     G. Age:  29w 0d         32  %    CI:        77.09   %    70 - 86  FL/HC:      20.8   %    19.6 - 20.8  HC:      260.4  mm     G. Age:  28w 2d          5  %    HC/AC:      1.09        0.99 - 1.21  AC:      239.7  mm     G. Age:  28w 2d         19  %    FL/BPD:     75.1   %    71 - 87  FL:       54.2  mm     G. Age:  28w 5d         23  %    FL/AC:      22.6   %    20 - 24  Est. FW:    1230  gm    2 lb 11 oz      17  % ---------------------------------------------------------------------- OB History  Gravidity:    2         Term:   0        Prem:   0        SAB:   1  TOP:          0       Ectopic:  0        Living: 0 ---------------------------------------------------------------------- Gestational Age  LMP:           29w 1d        Date:  09/24/22                   EDD:   07/01/23  U/S Today:     28w 4d                                        EDD:   07/05/23  Best:          29w 1d     Det. By:  LMP  (09/24/22)          EDD:   07/01/23 ---------------------------------------------------------------------- Targeted Anatomy  Central Nervous System  Calvarium/Cranial V.:  Appears normal         Cereb./Vermis:          Appears normal  Cavum:                 Appears normal         Cisterna Magna:         Previously seen  Lateral Ventricles:    Appears normal         Midline Falx:           Previously seen  Choroid Plexus:        Previously seen  Spine  Cervical:              Previously seen        Sacral:                 Previously seen  Thoracic:               Previously seen        Shape/Curvature:        Previously seen  Lumbar:  Previously seen  Head/Neck  Lips:                  Previously seen        Orbits/Eyes:            Previously seen  Nuchal Fold:           Not applicable         Mandible:               Previously seen  Nasal Bone:            Previously seen        Maxilla:                Previously seen  Profile:               Previously seen  Thorax  4 Chamber View:        Appears normal         Interventr. Septum:     Previously seen  Cardiac Activity:      Observed               Cardiac Axis:           Previously seen  Cardiac Situs:         Previously seen        Diaphragm:              Previously seen  Rt Outflow Tract:      Previously seen        3 Vessel View:          Previously seen  Lt Outflow Tract:      Previously seen        3 V Trachea View:       Previously seen  Aortic Arch:           Previously seen        IVC:                    Previously seen  Ductal Arch:           Previously seen        Crossing:               Previously seen  SVC:                   Previously seen  Abdomen  Cord Insertion:        Previously seen        Lt Kidney:              Appears normal  Situs:                 Appears normal         Rt Kidney:              Appears normal  Stomach:               Appears normal         Bladder:                Appears normal  Extremities  Lt Humerus:            Previously seen        Lt Femur:               Previously seen  Rt Humerus:  Previously seen        Rt Femur:               Previously seen  Lt Forearm:            Previously seen        Lt Lower Leg:           Previously seen  Rt Forearm:            Previously seen        Rt Lower Leg:           Previously seen  Lt Hand:               Previously seen        Lt Foot:                Previously seen  Rt Hand:               Previously seen        Rt Foot:                Previously seen  Other  Umbilical Cord:        Previously seen        Genitalia:               Previously seen  Comment:     Fetal anatomic survey complete. ---------------------------------------------------------------------- Cervix Uterus Adnexa  Cervix  Not visualized (advanced GA >24wks) ---------------------------------------------------------------------- Comments  This patient was seen for a follow up growth scan due to  chronic hypertension treated with labetalol .  She denies any  problems since her last exam and has screened negative for  gestational diabetes.  She was informed that the fetal growth appears appropriate  for her gestational age.  Mild polyhydramnios with a total AFI  of 25.1 cm was noted today.  Due to chronic hypertension, we will start weekly fetal testing  at 32 weeks.  She will return in 3 weeks for a growth scan and BPP. ----------------------------------------------------------------------                   Steffan Keys, MD Electronically Signed Final Report   04/16/2023 03:34 pm ----------------------------------------------------------------------    Assessment and Plan:  Pregnancy: G2P0010 at [redacted]w[redacted]d 1. Essential hypertension in pregnancy (Primary) Continue Labetalol  200 mg po bid, BP stable. Already scheduled for weekly antenatal testing starting at 32 weeks, serial growth scans, last EFW 17%  2. Polyhydramnios in third trimester complication, single or unspecified fetus Continue MFM scans as recommended  3. Maternal iron  deficiency anemia affecting pregnancy in third trimester, antepartum Undergoing Venofer  infusions currently.  4. Need for tetanus, diphtheria, and acellular pertussis (Tdap) vaccine - Tdap vaccine greater than or equal to 7yo IM given today.  5. [redacted] weeks gestation of pregnancy 6. Supervision of high risk pregnancy, antepartum No other concerns. Preterm labor symptoms and general obstetric precautions including but not limited to vaginal bleeding, contractions, leaking of fluid and fetal movement were reviewed in detail with the  patient. Please refer to After Visit Summary for other counseling recommendations.   Return in about 2 weeks (around 05/11/2023) for OFFICE OB VISIT (MD only).  Future Appointments  Date Time Provider Department Center  05/07/2023  3:15 PM [facility] NURSE [facility]  05/07/2023  3:30 PM [facility] US2 [facility]  05/11/2023  4:10 PM Kekoa Fyock, Gloris LABOR, MD [facility]  05/13/2023  9:00 AM [facility] 8 [facility] None  05/14/2023  3:15 PM  [facility] NURSE [facility]  05/14/2023  3:30 PM [facility] US1 [facility]  05/21/2023  3:15 PM [facility] NURSE [facility]  05/21/2023  3:30 PM [facility] US6 [facility]  05/25/2023  4:10 PM Erik Kieth BROCKS, MD [facility]  06/01/2023  4:10 PM Kallan Merrick, Gloris LABOR, MD [facility]  06/08/2023  4:10 PM Izell Harari, MD [facility]  06/15/2023  4:10 PM Akirah Storck, Gloris LABOR, MD [facility]    Gloris Hugger, MD

## 2023-04-27 NOTE — Patient Instructions (Addendum)
Return to office for any scheduled appointments. Call the office or go to the MAU at [facility] at [facility] if: You begin to have strong, frequent contractions Your water breaks.  Sometimes it is a big gush of fluid, sometimes it is just a trickle that keeps getting your underwear wet or running down your legs You have vaginal bleeding.  It is normal to have a small amount of spotting if your cervix was checked.  You do not feel your baby moving like normal.  If you do not, get something to eat and drink and lay down and focus on feeling your baby move.   If your baby is still not moving like normal, you should call the office or go to MAU. Any other obstetric concerns.    RSV Vaccination for Pregnant People  CDC recommends two ways to protect babies from getting very sick with Respiratory Syncytial Virus (RSV):  An RSV vaccination given during pregnancy  Pfizer's vaccine Verdis Frederickson) is recommended for use during pregnancy. It is given during RSV season to people who are 32 through [redacted] weeks pregnant.  Or, An RSV immunization given directly to infants and some older babies  Babies born to mothers who get RSV vaccine at least 2 weeks before delivery will have protection and, in most cases, should not need an RSV immunization later.    When is RSV season?  In most regions of the [place] RSV season starts in the fall and peaks in the winter, but the timing and severity of RSV season can vary from place to place and year to year.   The goal of maternal RSV vaccination is to protect babies from getting very sick with RSV during their first RSV season.  In most of the [place], this means maternal RSV vaccine will be given in September through January.  Who should get the maternal RSV vaccine?  People who are 59 through [redacted] weeks pregnant during September through January should get one dose of maternal RSV vaccine to protect their babies. RSV season can  vary around the country.   How is the maternal RSV vaccine administered?  Maternal RSV vaccine is given as a shot into the mother's upper arm. Only a single dose (one shot) of maternal RSV vaccine is recommended.   It is not yet known whether another dose might be needed in later pregnancies.  How well does the maternal RSV vaccine work?  When someone gets RSV vaccine, their body responds by making a protein that protects against the virus that causes RSV. The process takes about 2 weeks. When a pregnant person gets RSV vaccine, their protective proteins (called antibodies) also pass to their baby. So, babies who are born at least 2 weeks after their mother gets RSV vaccine are protected at birth, when infants are at the highest risk of severe RSV disease.   The vaccine can reduce a baby's risk of being hospitalized from RSV by 57% in the first six months after birth.  What are the possible side effects of the maternal RSV vaccine?  In the clinical trials, the side effects most often reported by pregnant people who received the maternal RSV vaccine were pain at the injection site, headache, muscle pain, and nausea.  Although not common, a dangerous high blood pressure condition called pre-eclampsia occurred in 1.8% of pregnant people who received the maternal RSV vaccine compared to 1.4% of pregnant people who received a placebo.  The clinical trials  identified a small increase in the number of preterm births in vaccinated pregnant people. It is not clear if this is a true safety problem related to RSV vaccine or if this occurred for reasons unrelated to vaccination.  To reduce the potential risk of preterm birth and complications from RSV disease, FDA approved the maternal RSV vaccine for use during weeks 32 through 66 of pregnancy while additional studies are conducted.  FDA is requiring the manufacturer to do additional studies that will look more closely at the potential risk of preterm  births and pregnancy-related high blood pressure issues in mothers, including pre-eclampsia.  Severe allergic reactions to vaccines are rare but can happen after any vaccine and can be life-threatening. If you see signs of a severe allergic reaction after vaccination (hives, swelling of the face and throat, difficulty breathing, a fast heartbeat, dizziness, or weakness), seek immediate medical care by calling 911.  As with any medicine or vaccine there is a very remote chance of the vaccine causing other serious injury or death after vaccination.  Adverse events following vaccination should be reported to the Vaccine Adverse Event Reporting System (VAERS), even if it's not clear that the vaccine caused the adverse event. You or your doctor can report an adverse event to [facility] and FDA through VAERS. If you need further assistance reporting to VAERS, please email [email or web address] or call [phone].  If you have any questions about side effects from the maternal RSV vaccine, talk with your healthcare provider.  Do I need a prescription for a maternal RSV vaccine?  Until the vaccine available in the office, you will need a prescription to take to a local pharmacy that is providing the vaccine.   How do I pay for the maternal RSV vaccine?  Most private health insurance plans cover the maternal RSV vaccine, but there may be a cost to you depending on your plan.  Contact your insurer to find out.  Medicaid Beginning January 18, 2022, most people with coverage from [facility] and [employer] [facility]) will be guaranteed coverage of all vaccines recommended by the Advisory Committee on Immunization Practice at no cost to them.   Source: [facility] for Immunization and Respiratory Diseases  TDaP Vaccine Pregnancy Get the Whooping Cough Vaccine While You Are Pregnant (CDC)  It is important for women to get the whooping cough vaccine in the third trimester of each pregnancy.  Vaccines are the best way to prevent this disease. There are 2 different whooping cough vaccines. Both vaccines combine protection against whooping cough, tetanus and diphtheria, but they are for different age groups: Tdap: for everyone 11 years or older, including pregnant women  DTaP: for children 2 months through 4 years of age  You need the whooping cough vaccine during each of your pregnancies The recommended time to get the shot is during your 27th through 36th week of pregnancy, preferably during the earlier part of this time period. The Centers for Disease Control and Prevention (CDC) recommends that pregnant women receive the whooping cough vaccine for adolescents and adults (called Tdap vaccine) during the third trimester of each pregnancy. The recommended time to get the shot is during your 27th through 36th week of pregnancy, preferably during the earlier part of this time period. This replaces the original recommendation that pregnant women get the vaccine only if they had not previously received it. The [employer] of Obstetricians and Gynecologists and the [employer] support this recommendation.  You should get  the whooping cough vaccine while pregnant to pass protection to your baby frame support disabled and/or not supported in this browser  Learn why Vernona Rieger decided to get the whooping cough vaccine in her 3rd trimester of pregnancy and how her baby girl was born with some protection against the disease. Also available on YouTube. After receiving the whooping cough vaccine, your body will create protective antibodies (proteins produced by the body to fight off diseases) and pass some of them to your baby before birth. These antibodies provide your baby some short-term protection against whooping cough in early life. These antibodies can also protect your baby from some of the more serious complications that come along with whooping cough. Your protective  antibodies are at their highest about 2 weeks after getting the vaccine, but it takes time to pass them to your baby. So the preferred time to get the whooping cough vaccine is early in your third trimester. The amount of whooping cough antibodies in your body decreases over time. That is why CDC recommends you get a whooping cough vaccine during each pregnancy. Doing so allows each of your babies to get the greatest number of protective antibodies from you. This means each of your babies will get the best protection possible against this disease.  Getting the whooping cough vaccine while pregnant is better than getting the vaccine after you give birth Whooping cough vaccination during pregnancy is ideal so your baby will have short-term protection as soon as he is born. This early protection is important because your baby will not start getting his whooping cough vaccines until he is 2 months old. These first few months of life are when your baby is at greatest risk for catching whooping cough. This is also when he's at greatest risk for having severe, potentially life-threating complications from the infection. To avoid that gap in protection, it is best to get a whooping cough vaccine during pregnancy. You will then pass protection to your baby before he is born. To continue protecting your baby, he should get whooping cough vaccines starting at 2 months old. You may never have gotten the Tdap vaccine before and did not get it during this pregnancy. If so, you should make sure to get the vaccine immediately after you give birth, before leaving the hospital or birthing center. It will take about 2 weeks before your body develops protection (antibodies) in response to the vaccine. Once you have protection from the vaccine, you are less likely to give whooping cough to your newborn while caring for him. But remember, your baby will still be at risk for catching whooping cough from others. A recent study looked  to see how effective Tdap was at preventing whooping cough in babies whose mothers got the vaccine while pregnant or in the hospital after giving birth. The study found that getting Tdap between 27 through 36 weeks of pregnancy is 85% more effective at preventing whooping cough in babies younger than 2 months old. Blood tests cannot tell if you need a whooping cough vaccine There are no blood tests that can tell you if you have enough antibodies in your body to protect yourself or your baby against whooping cough. Even if you have been sick with whooping cough in the past or previously received the vaccine, you still should get the vaccine during each pregnancy. Breastfeeding may pass some protective antibodies onto your baby By breastfeeding, you may pass some antibodies you have made in response to the vaccine to your  baby. When you get a whooping cough vaccine during your pregnancy, you will have antibodies in your breast milk that you can share with your baby as soon as your milk comes in. However, your baby will not get protective antibodies immediately if you wait to get the whooping cough vaccine until after delivering your baby. This is because it takes about 2 weeks for your body to create antibodies. Learn more about the health benefits of breastfeeding.

## 2023-05-01 ENCOUNTER — Encounter: Payer: Self-pay | Admitting: Obstetrics and Gynecology

## 2023-05-07 ENCOUNTER — Encounter: Payer: Self-pay | Admitting: Obstetrics and Gynecology

## 2023-05-07 ENCOUNTER — Other Ambulatory Visit: Payer: Self-pay | Admitting: *Deleted

## 2023-05-07 ENCOUNTER — Other Ambulatory Visit: Payer: Self-pay | Admitting: Obstetrics

## 2023-05-07 ENCOUNTER — Ambulatory Visit: Payer: Managed Care, Other (non HMO) | Attending: Obstetrics

## 2023-05-07 ENCOUNTER — Ambulatory Visit: Payer: Managed Care, Other (non HMO) | Admitting: *Deleted

## 2023-05-07 VITALS — BP 133/78 | HR 87

## 2023-05-07 DIAGNOSIS — O0990 Supervision of high risk pregnancy, unspecified, unspecified trimester: Secondary | ICD-10-CM | POA: Diagnosis present

## 2023-05-07 DIAGNOSIS — O1213 Gestational proteinuria, third trimester: Secondary | ICD-10-CM

## 2023-05-07 DIAGNOSIS — Z148 Genetic carrier of other disease: Secondary | ICD-10-CM | POA: Diagnosis not present

## 2023-05-07 DIAGNOSIS — O10913 Unspecified pre-existing hypertension complicating pregnancy, third trimester: Secondary | ICD-10-CM

## 2023-05-07 DIAGNOSIS — O409XX0 Polyhydramnios, unspecified trimester, not applicable or unspecified: Secondary | ICD-10-CM | POA: Diagnosis present

## 2023-05-07 DIAGNOSIS — O285 Abnormal chromosomal and genetic finding on antenatal screening of mother: Secondary | ICD-10-CM

## 2023-05-07 DIAGNOSIS — Z3A32 32 weeks gestation of pregnancy: Secondary | ICD-10-CM

## 2023-05-07 DIAGNOSIS — O10019 Pre-existing essential hypertension complicating pregnancy, unspecified trimester: Secondary | ICD-10-CM | POA: Insufficient documentation

## 2023-05-07 DIAGNOSIS — O10013 Pre-existing essential hypertension complicating pregnancy, third trimester: Secondary | ICD-10-CM

## 2023-05-07 DIAGNOSIS — O403XX0 Polyhydramnios, third trimester, not applicable or unspecified: Secondary | ICD-10-CM | POA: Diagnosis not present

## 2023-05-07 DIAGNOSIS — O365930 Maternal care for other known or suspected poor fetal growth, third trimester, not applicable or unspecified: Secondary | ICD-10-CM

## 2023-05-09 ENCOUNTER — Other Ambulatory Visit: Payer: Self-pay | Admitting: Obstetrics and Gynecology

## 2023-05-11 ENCOUNTER — Encounter: Payer: Self-pay | Admitting: Obstetrics & Gynecology

## 2023-05-11 ENCOUNTER — Encounter: Payer: Self-pay | Admitting: Obstetrics and Gynecology

## 2023-05-11 ENCOUNTER — Ambulatory Visit: Payer: Managed Care, Other (non HMO) | Admitting: Obstetrics & Gynecology

## 2023-05-11 VITALS — BP 135/78 | HR 111 | Wt 180.6 lb

## 2023-05-11 DIAGNOSIS — O10019 Pre-existing essential hypertension complicating pregnancy, unspecified trimester: Secondary | ICD-10-CM

## 2023-05-11 DIAGNOSIS — O403XX0 Polyhydramnios, third trimester, not applicable or unspecified: Secondary | ICD-10-CM

## 2023-05-11 DIAGNOSIS — O365930 Maternal care for other known or suspected poor fetal growth, third trimester, not applicable or unspecified: Secondary | ICD-10-CM

## 2023-05-11 DIAGNOSIS — D509 Iron deficiency anemia, unspecified: Secondary | ICD-10-CM

## 2023-05-11 DIAGNOSIS — O0990 Supervision of high risk pregnancy, unspecified, unspecified trimester: Secondary | ICD-10-CM

## 2023-05-11 DIAGNOSIS — Z3A32 32 weeks gestation of pregnancy: Secondary | ICD-10-CM

## 2023-05-11 DIAGNOSIS — O99013 Anemia complicating pregnancy, third trimester: Secondary | ICD-10-CM

## 2023-05-11 DIAGNOSIS — O10013 Pre-existing essential hypertension complicating pregnancy, third trimester: Secondary | ICD-10-CM

## 2023-05-11 NOTE — Patient Instructions (Signed)
RSV Vaccination for Pregnant People  CDC recommends two ways to protect babies from getting very sick with Respiratory Syncytial Virus (RSV):  An RSV vaccination given during pregnancy  Pf[employer]'s vaccine Verdis Frederickson) is recommended for use during pregnancy. It is given during RSV season to people who are 32 through [redacted] weeks pregnant.  Or, An RSV immunization given directly to infants and some older babies  Babies born to mothers who get RSV vaccine at least 2 weeks before delivery will have protection and, in most cases, should not need an RSV immunization later.    When is RSV season?  In most regions of the [place] RSV season starts in the fall and peaks in the winter, but the timing and severity of RSV season can vary from place to place and year to year.   The goal of maternal RSV vaccination is to protect babies from getting very sick with RSV during their first RSV season.  In most of the [place], this means maternal RSV vaccine will be given in September through January.  Who should get the maternal RSV vaccine?  People who are 25 through [redacted] weeks pregnant during September through January should get one dose of maternal RSV vaccine to protect their babies. RSV season can vary around the country.   How is the maternal RSV vaccine administered?  Maternal RSV vaccine is given as a shot into the mother's upper arm. Only a single dose (one shot) of maternal RSV vaccine is recommended.   It is not yet known whether another dose might be needed in later pregnancies.  How well does the maternal RSV vaccine work?  When someone gets RSV vaccine, their body responds by making a protein that protects against the virus that causes RSV. The process takes about 2 weeks. When a pregnant person gets RSV vaccine, their protective proteins (called antibodies) also pass to their baby. So, babies who are born at least 2 weeks after their mother gets RSV vaccine are protected  at birth, when infants are at the highest risk of severe RSV disease.   The vaccine can reduce a baby's risk of being hospitalized from RSV by 57% in the first six months after birth.  What are the possible side effects of the maternal RSV vaccine?  In the clinical trials, the side effects most often reported by pregnant people who received the maternal RSV vaccine were pain at the injection site, headache, muscle pain, and nausea.  Although not common, a dangerous high blood pressure condition called pre-eclampsia occurred in 1.8% of pregnant people who received the maternal RSV vaccine compared to 1.4% of pregnant people who received a placebo.  The clinical trials identified a small increase in the number of preterm births in vaccinated pregnant people. It is not clear if this is a true safety problem related to RSV vaccine or if this occurred for reasons unrelated to vaccination.  To reduce the potential risk of preterm birth and complications from RSV disease, FDA approved the maternal RSV vaccine for use during weeks 32 through 74 of pregnancy while additional studies are conducted.  FDA is requiring the manufacturer to do additional studies that will look more closely at the potential risk of preterm births and pregnancy-related high blood pressure issues in mothers, including pre-eclampsia.  Severe allergic reactions to vaccines are rare but can happen after any vaccine and can be life-threatening. If you see signs of a severe allergic reaction after vaccination (hives, swelling of the face  and throat, difficulty breathing, a fast heartbeat, dizziness, or weakness), seek immediate medical care by calling 911.  As with any medicine or vaccine there is a very remote chance of the vaccine causing other serious injury or death after vaccination.  Adverse events following vaccination should be reported to the Vaccine Adverse Event Reporting System (VAERS), even if it's not clear that the vaccine  caused the adverse event. You or your doctor can report an adverse event to [facility] and FDA through VAERS. If you need further assistance reporting to VAERS, please email [email or web address] or call [phone].  If you have any questions about side effects from the maternal RSV vaccine, talk with your healthcare provider.  Do I need a prescription for a maternal RSV vaccine?  Until the vaccine available in the office, you will need a prescription to take to a local pharmacy that is providing the vaccine.   How do I pay for the maternal RSV vaccine?  Most private health insurance plans cover the maternal RSV vaccine, but there may be a cost to you depending on your plan.  Contact your insurer to find out.  Medicaid Beginning January 18, 2022, most people with coverage from [facility] and [employer] [facility]) will be guaranteed coverage of all vaccines recommended by the Advisory Committee on Immunization Practice at no cost to them.   Source: [facility] for Immunization and Respiratory Diseases

## 2023-05-11 NOTE — Progress Notes (Signed)
PRENATAL VISIT NOTE  Subjective:  Janet Villanueva is a 28 y.o. G2P0010 at [redacted]w[redacted]d being seen today for ongoing prenatal care.  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  Patient reports no complaints.  Contractions: Not present. Vag. Bleeding: None.  Movement: Present. Denies leaking of fluid.   The following portions of the patient's history were reviewed and updated as appropriate: allergies, current medications, past family history, past medical history, past social history, past surgical history and problem list.   Objective:   Vitals:   05/11/23 1411  BP: 135/78  Pulse: (!) 111  Weight: 180 lb 9.6 oz (81.9 kg)    Fetal Status: Fetal Heart Rate (bpm): 147   Movement: Present     General:  Alert, oriented and cooperative. Patient is in no acute distress.  Skin: Skin is warm and dry. No rash noted.   Cardiovascular: Normal heart rate noted  Respiratory: Normal respiratory effort, no problems with respiration noted  Abdomen: Soft, gravid, appropriate for gestational age.  Pain/Pressure: Absent     Pelvic: Cervical exam deferred        Extremities: Normal range of motion.  Edema: None  Mental Status: Normal mood and affect. Normal behavior. Normal judgment and thought content.   [place] MFM FETAL BPP WO NON STRESS Result Date: 05/07/2023 ----------------------------------------------------------------------  OBSTETRICS REPORT                       (Signed Final 05/07/2023 04:16 pm)  ---------------------------------------------------------------------- Patient Info  ID #:       [id number]                          D.O.B.:  1995/11/05 (27 yrs)(F)  Name:       Janet Villanueva                  Visit Date: 05/07/2023 03:21 pm              [place] ---------------------------------------------------------------------- Performed By  Attending:        Braxton Feathers DO       Ref. Address:     [address]                                                             [address]  Performed By:     Tommie Raymond BS,       [facility], [facility]T                                [facility]  Referred By:      [facility] ---------------------------------------------------------------------- Orders  #  Description                           Code        Ordered By  1  [place] MFM FETAL BPP WO NON               [id number]    YU FANG     STRESS  2  [place] MFM OB FOLLOW UP                   [id number]    YU FANG ----------------------------------------------------------------------  #  Order #                     Accession #                Episode #  1  [id number]                   [id number]                 [id number]  2  [id number]                   [id number]                 [id number] ---------------------------------------------------------------------- Indications  Hypertension - Chronic/Pre-existing            O10.019  (labetalol)  Polyhydramnios, third trimester, antepartum    O40.3XX0  condition or complication, unspecified fetus  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 Cord Insertion:      Previously seen  Amniotic Fluid  AFI FV:      Within normal limits  AFI Sum(cm)     %Tile       Largest Pocket(cm)  18.38           68          7.7  RUQ(cm)       RLQ(cm)       LUQ(cm)        LLQ(cm)  4.28          3.04          7.7            3.36 ---------------------------------------------------------------------- Biophysical Evaluation  Amniotic F.V:  Within normal limits       F. Tone:        Observed  F. Movement:    Observed                   Score:          8/8  F. Breathing:   Observed ---------------------------------------------------------------------- Biometry  BPD:      79.6  mm     G. Age:  32w 0d         35  %    CI:        74.78   %    70 - 86                                                          FL/HC:      19.5   %    19.1 - 21.3  HC:      292.1  mm     G. Age:  32w 1d         16  %    HC/AC:      1.08        0.96 - 1.17  AC:      270.4  mm     G. Age:  31w 1d         21  %    FL/BPD:     71.6   %    71 - 87  FL:         57  mm     G. Age:  29w 6d        2.2  %    FL/AC:      21.1   %    20 - 24  LV:        3.3  mm  Est. FW:    1664  gm    3 lb 11 oz      10  % ---------------------------------------------------------------------- OB History  Gravidity:    2         Term:   0        Prem:   0        SAB:   1  TOP:          0       Ectopic:  0        Living: 0 ---------------------------------------------------------------------- Gestational Age  LMP:           32w 1d        Date:  09/24/22                   EDD:   07/01/23  U/S Today:     31w 2d                                        EDD:   07/07/23  Best:          32w 1d     Det. By:  LMP  (09/24/22)          EDD:   07/01/23  ---------------------------------------------------------------------- Anatomy  Cranium:  Previously seen        Aortic Arch:            Previously seen  Cavum:                 Previously seen        Ductal Arch:            Previously seen  Ventricles:            Appears normal         Diaphragm:              Appears normal  Choroid Plexus:        Previously seen        Stomach:                Appears normal, left                                                                        sided  Cerebellum:            Previously seen        Abdomen:                Previously seen  Posterior Fossa:       Previously seen        Abdominal Wall:         Previously seen  Face:                  Orbits and profile     Cord Vessels:           Previously seen                         previously seen  Lips:                  Previously seen        Kidneys:                Appear normal  Thoracic:              Previously seen        Bladder:                Appears normal  Heart:                 Appears normal         Spine:                  Previously seen                         (4CH, axis, and                         situs)  RVOT:                  Previously seen        Upper Extremities:      Previously seen  LVOT:                  Previously seen  Lower Extremities:      Previously seen ---------------------------------------------------------------------- Cervix Uterus Adnexa  Cervix  Normal appearance by transabdominal scan  Uterus  No abnormality visualized.  Right Ovary  Within normal limits.  Left Ovary  Within normal limits.  Cul De Sac  No free fluid seen.  Adnexa  No abnormality visualized ---------------------------------------------------------------------- Comments  The patient is here for a follow-up BPP and growth ultrasound  at 32w 1d for [facility] and lower end of normal growth. EDD:  07/01/2023 dated by LMP  (09/24/22). She has no concerns  today.  Sonographic findings  Single intrauterine pregnancy.   Fetal cardiac activity: Observed.  Presentation: Breech.  Interval fetal anatomy appears normal.  Fetal biometry shows the estimated fetal weight at the 10  percentile and the abdominal circumference at the 21  percentile.  Amniotic fluid: Within normal limits.  MVP: 7.7 cm.  Placenta: Anterior.  BPP: 8/8.  Recommendations  1. Weekly antenatal testing until delivery  2. Growth ultrasounds every 3-4 weeks ----------------------------------------------------------------------                  Braxton Feathers, DO Electronically Signed Final Report   05/07/2023 04:16 pm ----------------------------------------------------------------------   [place] MFM OB FOLLOW UP Result Date: 05/07/2023 ----------------------------------------------------------------------  OBSTETRICS REPORT                       (Signed Final 05/07/2023 04:16 pm) ---------------------------------------------------------------------- Patient Info  ID #:       [id number]                          D.O.B.:  06/17/1995 (27 yrs)(F)  Name:       Janet Villanueva                  Visit Date: 05/07/2023 03:21 pm              [place] ---------------------------------------------------------------------- Performed By  Attending:        Braxton Feathers DO       Ref. Address:     [address]                                                             [address]  Performed By:     Tommie Raymond BS,       Location[facility]                    RD[facility], RVT                                [facility] at                                                             [facility]  Referred By:      [facility] ---------------------------------------------------------------------- Orders  #  Description                           Code        Ordered By  1  [place] MFM FETAL BPP WO NON               [id number]    YU FANG     STRESS  2  [place] MFM OB FOLLOW UP                   [id number]    YU FANG  ----------------------------------------------------------------------  #  Order #                     Accession #                Episode #  1  [id number]                   [id number]                 [id number]  2  [id number]                   [id number]                 [id number] ---------------------------------------------------------------------- Indications  Hypertension - Chronic/Pre-existing            O10.019  (labetalol)  Polyhydramnios, third trimester, antepartum    O40.3XX0  condition or complication, unspecified fetus  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 Cord Insertion:      Previously seen  Amniotic Fluid  AFI FV:      Within normal limits  AFI Sum(cm)     %Tile       Largest Pocket(cm)  18.38           68          7.7  RUQ(cm)       RLQ(cm)       LUQ(cm)        LLQ(cm)  4.28          3.04          7.7            3.36 ---------------------------------------------------------------------- Biophysical Evaluation  Amniotic F.V:   Within normal limits       F. Tone:        Observed  F. Movement:    Observed                   Score:          8/8  F. Breathing:   Observed ----------------------------------------------------------------------  Biometry  BPD:      79.6  mm     G. Age:  32w 0d         35  %    CI:        74.78   %    70 - 86                                                          FL/HC:      19.5   %    19.1 - 21.3  HC:      292.1  mm     G. Age:  32w 1d         16  %    HC/AC:      1.08        0.96 - 1.17  AC:      270.4  mm     G. Age:   31w 1d         21  %    FL/BPD:     71.6   %    71 - 87  FL:         57  mm     G. Age:  29w 6d        2.2  %    FL/AC:      21.1   %    20 - 24  LV:        3.3  mm  Est. FW:    1664  gm    3 lb 11 oz      10  % ---------------------------------------------------------------------- OB History  Gravidity:    2         Term:   0        Prem:   0        SAB:   1  TOP:          0       Ectopic:  0        Living: 0 ---------------------------------------------------------------------- Gestational Age  LMP:           32w 1d        Date:  09/24/22                   EDD:   07/01/23  U/S Today:     31w 2d                                        EDD:   07/07/23  Best:          32w 1d     Det. By:  LMP  (09/24/22)          EDD:   07/01/23 ---------------------------------------------------------------------- Anatomy  Cranium:               Previously seen        Aortic Arch:            Previously seen  Cavum:                 Previously seen        Ductal Arch:  Previously seen  Ventricles:            Appears normal         Diaphragm:              Appears normal  Choroid Plexus:        Previously seen        Stomach:                Appears normal, left                                                                        sided  Cerebellum:            Previously seen        Abdomen:                Previously seen  Posterior Fossa:       Previously seen        Abdominal Wall:         Previously seen  Face:                  Orbits and profile     Cord Vessels:           Previously seen                         previously seen  Lips:                  Previously seen        Kidneys:                Appear normal  Thoracic:              Previously seen        Bladder:                Appears normal  Heart:                 Appears normal         Spine:                  Previously seen                         (4CH, axis, and                         situs)  RVOT:                  Previously seen        Upper Extremities:       Previously seen  LVOT:                  Previously seen        Lower Extremities:      Previously seen ---------------------------------------------------------------------- Cervix Uterus Adnexa  Cervix  Normal appearance by transabdominal scan  Uterus  No abnormality visualized.  Right Ovary  Within normal limits.  Left Ovary  Within normal limits.  Cul De Sac  No free fluid seen.  Adnexa  No abnormality visualized ---------------------------------------------------------------------- Comments  The patient is here for a follow-up BPP and growth ultrasound  at 32w 1d for [facility] and lower end of normal growth. EDD:  07/01/2023 dated by LMP  (09/24/22). She has no concerns  today.  Sonographic findings  Single intrauterine pregnancy.  Fetal cardiac activity: Observed.  Presentation: Breech.  Interval fetal anatomy appears normal.  Fetal biometry shows the estimated fetal weight at the 10  percentile and the abdominal circumference at the 21  percentile.  Amniotic fluid: Within normal limits.  MVP: 7.7 cm.  Placenta: Anterior.  BPP: 8/8.  Recommendations  1. Weekly antenatal testing until delivery  2. Growth ultrasounds every 3-4 weeks ----------------------------------------------------------------------                  Braxton Feathers, DO Electronically Signed Final Report   05/07/2023 04:16 pm ----------------------------------------------------------------------   [place] MFM OB FOLLOW UP Result Date: 04/16/2023 ----------------------------------------------------------------------  OBSTETRICS REPORT                       (Signed Final 04/16/2023 03:34 pm) ---------------------------------------------------------------------- Patient Info  ID #:       [id number]                          D.O.B.:  [birth year] (27 yrs)(F)  Name:       Janet Villanueva                  Visit Date: 04/16/2023 02:34 pm              [place] ---------------------------------------------------------------------- Performed By  Attending:        Ma Rings MD         Ref. Address:     [address]                                                             [address]  Performed By:     Emeline Darling BS,      [facility]                    RDMS                                     [facility]  Referred By:      [facility] ---------------------------------------------------------------------- Orders  #  Description  Code        Ordered By  1  [place] MFM OB FOLLOW UP                   [id number]    Noralee Space ----------------------------------------------------------------------  #  Order #                     Accession #                Episode #  1  [id number]                   [id number]                 [id number] ---------------------------------------------------------------------- Indications  Hypertension - Chronic/Pre-existing            O10.019  (labetalol)  Polyhydramnios, third trimester, antepartum    O40.3XX0  condition or complication, unspecified fetus  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 Cord Insertion:      Previously seen  Amniotic Fluid  AFI FV:      Polyhydramnios  AFI Sum(cm)     %Tile       Largest Pocket(cm)  25.12           97          7.1  RUQ(cm)       RLQ(cm)       LUQ(cm)        LLQ(cm)  7.1           6.04          6.73           5.25  ---------------------------------------------------------------------- Biometry  BPD:      72.2  mm     G. Age:  29w 0d         32  %    CI:        77.09   %    70 - 86                                                          FL/HC:      20.8   %    19.6 - 20.8  HC:      260.4  mm     G. Age:  28w 2d          5  %    HC/AC:      1.09        0.99 - 1.21  AC:      239.7  mm  G. Age:  28w 2d         19  %    FL/BPD:     75.1   %    71 - 87  FL:       54.2  mm     G. Age:  28w 5d         23  %    FL/AC:      22.6   %    20 - 24  Est. FW:    1230  gm    2 lb 11 oz      17  % ---------------------------------------------------------------------- OB History  Gravidity:    2         Term:   0        Prem:   0        SAB:   1  TOP:          0       Ectopic:  0        Living: 0 ---------------------------------------------------------------------- Gestational Age  LMP:           29w 1d        Date:  09/24/22                   EDD:   07/01/23  U/S Today:     28w 4d                                        EDD:   07/05/23  Best:          29w 1d     Det. By:  LMP  (09/24/22)          EDD:   07/01/23 ---------------------------------------------------------------------- Targeted Anatomy  Central Nervous System  Calvarium/Cranial V.:  Appears normal         Cereb./Vermis:          Appears normal  Cavum:                 Appears normal         Cisterna Magna:         Previously seen  Lateral Ventricles:    Appears normal         Midline Falx:           Previously seen  Choroid Plexus:        Previously seen  Spine  Cervical:              Previously seen        Sacral:                 Previously seen  Thoracic:              Previously seen        Shape/Curvature:        Previously seen  Lumbar:                Previously seen  Head/Neck  Lips:                  Previously seen        Orbits/Eyes:            Previously seen  Nuchal Fold:           Not applicable         Mandible:  Previously seen  Nasal Bone:             Previously seen        Maxilla:                Previously seen  Profile:               Previously seen  Thorax  4 Chamber View:        Appears normal         Interventr. Septum:     Previously seen  Cardiac Activity:      Observed               Cardiac Axis:           Previously seen  Cardiac Situs:         Previously seen        Diaphragm:              Previously seen  Rt Outflow Tract:      Previously seen        3 Vessel View:          Previously seen  Lt Outflow Tract:      Previously seen        3 V Trachea View:       Previously seen  Aortic Arch:           Previously seen        IVC:                    Previously seen  Ductal Arch:           Previously seen        Crossing:               Previously seen  SVC:                   Previously seen  Abdomen  Cord Insertion:        Previously seen        Lt Kidney:              Appears normal  Situs:                 Appears normal         Rt Kidney:              Appears normal  Stomach:               Appears normal         Bladder:                Appears normal  Extremities  Lt Humerus:            Previously seen        Lt Femur:               Previously seen  Rt Humerus:            Previously seen        Rt Femur:               Previously seen  Lt Forearm:            Previously seen        Lt Lower Leg:           Previously seen  Rt Forearm:            Previously seen  Rt Lower Leg:           Previously seen  Lt Hand:               Previously seen        Lt Foot:                Previously seen  Rt Hand:               Previously seen        Rt Foot:                Previously seen  Other  Umbilical Cord:        Previously seen        Genitalia:              Previously seen  Comment:     Fetal anatomic survey complete. ---------------------------------------------------------------------- Cervix Uterus Adnexa  Cervix  Not visualized (advanced GA >24wks) ---------------------------------------------------------------------- Comments  This patient was seen for a  follow up growth scan due to  chronic hypertension treated with labetalol.  She denies any  problems since her last exam and has screened negative for  gestational diabetes.  She was informed that the fetal growth appears appropriate  for her gestational age.  Mild polyhydramnios with a total AFI  of 25.1 cm was noted today.  Due to chronic hypertension, we will start weekly fetal testing  at 32 weeks.  She will return in 3 weeks for a growth scan and BPP. ----------------------------------------------------------------------                   Ma Rings, MD Electronically Signed Final Report   04/16/2023 03:34 pm ----------------------------------------------------------------------    Assessment and Plan:  Pregnancy: G2P0010 at [redacted]w[redacted]d 1. IUGR (intrauterine growth restriction) affecting care of mother, third trimester, not applicable or unspecified fetus (Primary) EFW 10%.  Already scheduled for weekly antenatal testing, serial growth scans  2. Essential hypertension in pregnancy Continue Labetalol 200 mg po bid, BP stable. Already scheduled for weekly antenatal testing, serial growth scans  3. Polyhydramnios in third trimester complication, single or unspecified fetus Continue MFM scans as recommended   4. Maternal iron deficiency anemia affecting pregnancy in third trimester, antepartum Undergoing Venofer infusions currently.   5. [redacted] weeks gestation of pregnancy 6. Supervision of high risk pregnancy, antepartum Counseled about RSV vaccine, she will decide by next visit. Preterm labor symptoms and general obstetric precautions including but not limited to vaginal bleeding, contractions, leaking of fluid and fetal movement were reviewed in detail with the patient. Please refer to After Visit Summary for other counseling recommendations.   Return in about 2 weeks (around 05/25/2023) for OFFICE OB VISIT (MD only, possible RSV vaccine.  Future Appointments  Date Time Provider Department Center   05/13/2023  9:00 AM [facility]CHAIR 8 [facility] None  05/14/2023  3:15 PM [facility] NURSE [facility]  05/14/2023  3:30 PM [facility] US1 [facility]  05/21/2023  3:15 PM [facility] NURSE [facility]  05/21/2023  3:30 PM [facility] US6 [facility]  05/25/2023  4:10 PM Lennart Pall, MD [facility]  06/01/2023  4:10 PM Morine Kohlman, Jethro Bastos, MD [facility]  06/08/2023  4:10 PM Goldstream Bing, MD [facility]  06/15/2023  4:10 PM Nyjah Schwake, Jethro Bastos, MD [facility]    Jaynie Collins, MD

## 2023-05-13 ENCOUNTER — Ambulatory Visit (INDEPENDENT_AMBULATORY_CARE_PROVIDER_SITE_OTHER): Payer: Managed Care, Other (non HMO)

## 2023-05-13 VITALS — BP 131/90 | HR 91 | Temp 99.2°F | Resp 18 | Ht 65.0 in | Wt 181.0 lb

## 2023-05-13 DIAGNOSIS — D508 Other iron deficiency anemias: Secondary | ICD-10-CM | POA: Diagnosis not present

## 2023-05-13 DIAGNOSIS — Z3A33 33 weeks gestation of pregnancy: Secondary | ICD-10-CM | POA: Diagnosis not present

## 2023-05-13 DIAGNOSIS — D509 Iron deficiency anemia, unspecified: Secondary | ICD-10-CM

## 2023-05-13 DIAGNOSIS — O99013 Anemia complicating pregnancy, third trimester: Secondary | ICD-10-CM | POA: Diagnosis not present

## 2023-05-13 MED ORDER — DIPHENHYDRAMINE HCL 25 MG PO CAPS
25.0000 mg | ORAL_CAPSULE | Freq: Once | ORAL | Status: AC
  Administered 2023-05-13: 25 mg via ORAL

## 2023-05-13 MED ORDER — ACETAMINOPHEN 325 MG PO TABS
650.0000 mg | ORAL_TABLET | Freq: Once | ORAL | Status: AC
  Administered 2023-05-13: 650 mg via ORAL

## 2023-05-13 MED ORDER — SODIUM CHLORIDE 0.9 % IV SOLN
500.0000 mg | Freq: Once | INTRAVENOUS | Status: AC
  Administered 2023-05-13: 500 mg via INTRAVENOUS
  Filled 2023-05-13: qty 25

## 2023-05-13 MED ORDER — IRON SUCROSE 500 MG IVPB - SIMPLE MED
500.0000 mg | Freq: Once | INTRAVENOUS | Status: DC
Start: 2023-05-13 — End: 2023-05-13

## 2023-05-13 NOTE — Progress Notes (Signed)
Diagnosis: Iron Deficiency Anemia  Provider:  Chilton Greathouse MD  Procedure: IV Infusion  IV Type: Peripheral, IV Location: R Antecubital and L Antecubital (see progress note)  Venofer (Iron Sucrose), Dose: 500 mg  Infusion Start Time: 1000  Infusion Stop Time: 1417  Post Infusion IV Care: Observation period completed and Peripheral IV Discontinued  Discharge: Condition: Stable, Destination: Home . AVS Declined  Performed by:  Marlow Baars [place]tt, RN

## 2023-05-13 NOTE — Progress Notes (Addendum)
Patient's IV pump alarming.  Into bay to check and patient c/o pain at insertion site.  Inspected site and it was bruised and swollen.  PIV removed from right a/c and restarted PIV in left a/c  patient tolerated well and with no complaints.  Ice pack applied to right a/c site.  Iron restarted and this writer remained in room with patient insure patient's iv was infusing without difficulty.

## 2023-05-14 ENCOUNTER — Ambulatory Visit: Payer: Managed Care, Other (non HMO) | Admitting: *Deleted

## 2023-05-14 ENCOUNTER — Ambulatory Visit: Payer: Managed Care, Other (non HMO) | Attending: Obstetrics

## 2023-05-14 ENCOUNTER — Other Ambulatory Visit: Payer: Self-pay | Admitting: *Deleted

## 2023-05-14 ENCOUNTER — Other Ambulatory Visit: Payer: Self-pay

## 2023-05-14 VITALS — BP 126/75 | HR 85

## 2023-05-14 DIAGNOSIS — O10013 Pre-existing essential hypertension complicating pregnancy, third trimester: Secondary | ICD-10-CM | POA: Diagnosis not present

## 2023-05-14 DIAGNOSIS — Z3A33 33 weeks gestation of pregnancy: Secondary | ICD-10-CM

## 2023-05-14 DIAGNOSIS — O10913 Unspecified pre-existing hypertension complicating pregnancy, third trimester: Secondary | ICD-10-CM | POA: Diagnosis present

## 2023-05-14 DIAGNOSIS — O10019 Pre-existing essential hypertension complicating pregnancy, unspecified trimester: Secondary | ICD-10-CM | POA: Diagnosis present

## 2023-05-14 DIAGNOSIS — O1213 Gestational proteinuria, third trimester: Secondary | ICD-10-CM | POA: Diagnosis present

## 2023-05-14 DIAGNOSIS — D649 Anemia, unspecified: Secondary | ICD-10-CM | POA: Diagnosis not present

## 2023-05-14 DIAGNOSIS — O409XX0 Polyhydramnios, unspecified trimester, not applicable or unspecified: Secondary | ICD-10-CM | POA: Diagnosis present

## 2023-05-14 DIAGNOSIS — O99013 Anemia complicating pregnancy, third trimester: Secondary | ICD-10-CM | POA: Diagnosis not present

## 2023-05-14 DIAGNOSIS — O0990 Supervision of high risk pregnancy, unspecified, unspecified trimester: Secondary | ICD-10-CM | POA: Insufficient documentation

## 2023-05-14 DIAGNOSIS — O403XX0 Polyhydramnios, third trimester, not applicable or unspecified: Secondary | ICD-10-CM | POA: Diagnosis not present

## 2023-05-14 DIAGNOSIS — Z148 Genetic carrier of other disease: Secondary | ICD-10-CM

## 2023-05-21 ENCOUNTER — Ambulatory Visit: Payer: Managed Care, Other (non HMO) | Admitting: *Deleted

## 2023-05-21 ENCOUNTER — Ambulatory Visit: Payer: Managed Care, Other (non HMO) | Attending: Obstetrics

## 2023-05-21 ENCOUNTER — Other Ambulatory Visit: Payer: Self-pay | Admitting: *Deleted

## 2023-05-21 VITALS — BP 139/71 | HR 76

## 2023-05-21 DIAGNOSIS — O99013 Anemia complicating pregnancy, third trimester: Secondary | ICD-10-CM | POA: Diagnosis not present

## 2023-05-21 DIAGNOSIS — O1213 Gestational proteinuria, third trimester: Secondary | ICD-10-CM | POA: Insufficient documentation

## 2023-05-21 DIAGNOSIS — O403XX0 Polyhydramnios, third trimester, not applicable or unspecified: Secondary | ICD-10-CM

## 2023-05-21 DIAGNOSIS — O10013 Pre-existing essential hypertension complicating pregnancy, third trimester: Secondary | ICD-10-CM

## 2023-05-21 DIAGNOSIS — Z148 Genetic carrier of other disease: Secondary | ICD-10-CM

## 2023-05-21 DIAGNOSIS — O409XX0 Polyhydramnios, unspecified trimester, not applicable or unspecified: Secondary | ICD-10-CM | POA: Insufficient documentation

## 2023-05-21 DIAGNOSIS — O10019 Pre-existing essential hypertension complicating pregnancy, unspecified trimester: Secondary | ICD-10-CM | POA: Insufficient documentation

## 2023-05-21 DIAGNOSIS — O0990 Supervision of high risk pregnancy, unspecified, unspecified trimester: Secondary | ICD-10-CM | POA: Insufficient documentation

## 2023-05-21 DIAGNOSIS — O10913 Unspecified pre-existing hypertension complicating pregnancy, third trimester: Secondary | ICD-10-CM | POA: Diagnosis present

## 2023-05-21 DIAGNOSIS — Z3A34 34 weeks gestation of pregnancy: Secondary | ICD-10-CM

## 2023-05-21 DIAGNOSIS — D649 Anemia, unspecified: Secondary | ICD-10-CM

## 2023-05-25 ENCOUNTER — Ambulatory Visit: Payer: Managed Care, Other (non HMO) | Admitting: Obstetrics and Gynecology

## 2023-05-25 VITALS — BP 130/70 | HR 96 | Wt 183.4 lb

## 2023-05-25 DIAGNOSIS — O0990 Supervision of high risk pregnancy, unspecified, unspecified trimester: Secondary | ICD-10-CM | POA: Diagnosis not present

## 2023-05-25 DIAGNOSIS — Z148 Genetic carrier of other disease: Secondary | ICD-10-CM

## 2023-05-25 DIAGNOSIS — O403XX0 Polyhydramnios, third trimester, not applicable or unspecified: Secondary | ICD-10-CM

## 2023-05-25 DIAGNOSIS — O99013 Anemia complicating pregnancy, third trimester: Secondary | ICD-10-CM

## 2023-05-25 DIAGNOSIS — O10019 Pre-existing essential hypertension complicating pregnancy, unspecified trimester: Secondary | ICD-10-CM | POA: Diagnosis not present

## 2023-05-25 DIAGNOSIS — Z3A34 34 weeks gestation of pregnancy: Secondary | ICD-10-CM

## 2023-05-25 DIAGNOSIS — O1213 Gestational proteinuria, third trimester: Secondary | ICD-10-CM

## 2023-05-25 DIAGNOSIS — O365930 Maternal care for other known or suspected poor fetal growth, third trimester, not applicable or unspecified: Secondary | ICD-10-CM

## 2023-05-25 DIAGNOSIS — D509 Iron deficiency anemia, unspecified: Secondary | ICD-10-CM

## 2023-05-25 NOTE — Progress Notes (Signed)
   PRENATAL VISIT NOTE  Subjective:  Janet Villanueva is a 28 y.o. G2P0010 at [redacted]w[redacted]d being seen today for ongoing prenatal care.  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  Patient reports  headache .  Contractions: Not present. Vag. Bleeding: None.  Movement: Present. Denies leaking of fluid.   The following portions of the patient's history were reviewed and updated as appropriate: allergies, current medications, past family history, past medical history, past social history, past surgical history and problem list.   Objective:   Vitals:   05/25/23 1620  BP: 130/70  Pulse: 96  Weight: 183 lb 6.4 oz (83.2 kg)    Fetal Status: Fetal Heart Rate (bpm): 154   Movement: Present     General:  Alert, oriented and cooperative. Patient is in no acute distress.  Skin: Skin is warm and dry. No rash noted.   Cardiovascular: Normal heart rate noted  Respiratory: Normal respiratory effort, no problems with respiration noted  Abdomen: Soft, gravid, appropriate for gestational age.  Pain/Pressure: Present      Assessment and Plan:  Pregnancy: G2P0010 at [redacted]w[redacted]d 1. Supervision of high risk pregnancy, antepartum (Primary) 2. [redacted] weeks gestation of pregnancy GBS/GC/CT next visit RSV vaccine discussed, declines for now  3. Essential hypertension in pregnancy 4. Proteinuria affecting pregnancy in third trimester BP well controlled on labetalol  200mg  BID Mild headache (2/10) since the weekend. Tried tylenol  x 1 without relief.  Eating/drinking normally. Discussed trial of excedrin tension HA (tylenol -caffeine) and to call if HA does not resolve ldASA Baseline PCR 914, otherwise labs normal Weekly antenatal testing scheduled through MFM @32 /1: 1664g (10%), AC 21%, 18.38, breech, anterior  5. IUGR (intrauterine growth restriction) affecting care of mother, third trimester, not applicable or unspecified fetus @32 /1: 1664g (10%), AC 21%, 18.38, breech, anterior Weekly antenatal testing  6. Polyhydramnios in third trimester complication, single or unspecified fetus Resolved  7. Maternal iron  deficiency anemia affecting pregnancy in third trimester, antepartum S/p IV iron  Hgb 9.1 on 1/3 Repeat CBC next visit  8. 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   Return in about 2 weeks (around 06/08/2023) for return OB at 36 weeks with GBS/GC/CT.  Future Appointments  Date Time Provider Department Center  05/28/2023  3:15 PM [facility] NST [facility]  06/04/2023  7:15 AM [facility] NURSE [facility]  06/04/2023  7:30 AM [facility] US6 [facility]  06/08/2023  4:10 PM Izell Harari, MD [facility]  06/11/2023  7:30 AM [facility] US5 [facility]  06/15/2023  4:10 PM Anyanwu, Gloris LABOR, MD [facility]  06/22/2023  4:10 PM Anyanwu, Gloris LABOR, MD [facility]  06/29/2023  4:10 PM Erik Kieth BROCKS, MD [facility]   Kieth BROCKS Erik, MD

## 2023-05-25 NOTE — Progress Notes (Signed)
ROB: Pt stating that she has had a headache since Sunday, tylenol no benefit  Also noticing increasing cramping

## 2023-05-28 ENCOUNTER — Ambulatory Visit: Payer: Managed Care, Other (non HMO) | Attending: Obstetrics and Gynecology | Admitting: *Deleted

## 2023-05-28 DIAGNOSIS — Z3A35 35 weeks gestation of pregnancy: Secondary | ICD-10-CM | POA: Diagnosis not present

## 2023-05-28 DIAGNOSIS — O365930 Maternal care for other known or suspected poor fetal growth, third trimester, not applicable or unspecified: Secondary | ICD-10-CM

## 2023-05-28 NOTE — Procedures (Signed)
 Janet Villanueva 1996-03-07 [redacted]w[redacted]d  Fetus A Non-Stress Test Interpretation for 05/28/23  [facility] only  Indication: IUGR  Fetal Heart Rate A Mode: External Baseline Rate (A): 140 bpm Variability: Moderate Accelerations: 15 x 15 Decelerations: None Multiple birth?: No  Uterine Activity Mode: Palpation, Toco Contraction Frequency (min): none Resting Tone Palpated: Relaxed  Interpretation (Fetal Testing) Nonstress Test Interpretation: Reactive Overall Impression: Reassuring for gestational age Comments: Dr. Arna reviewed tracing

## 2023-05-31 ENCOUNTER — Other Ambulatory Visit: Payer: Managed Care, Other (non HMO)

## 2023-06-01 ENCOUNTER — Encounter: Payer: Self-pay | Admitting: Obstetrics & Gynecology

## 2023-06-01 ENCOUNTER — Encounter: Payer: Self-pay | Admitting: *Deleted

## 2023-06-01 ENCOUNTER — Encounter: Payer: BC Managed Care – PPO | Admitting: Obstetrics & Gynecology

## 2023-06-01 ENCOUNTER — Other Ambulatory Visit (HOSPITAL_COMMUNITY)
Admission: RE | Admit: 2023-06-01 | Discharge: 2023-06-01 | Disposition: A | Discharge status: Home / Self Care | Payer: Managed Care, Other (non HMO) | Source: Ambulatory Visit | Attending: Obstetrics & Gynecology | Admitting: Obstetrics & Gynecology

## 2023-06-01 ENCOUNTER — Ambulatory Visit (INDEPENDENT_AMBULATORY_CARE_PROVIDER_SITE_OTHER): Payer: Managed Care, Other (non HMO) | Admitting: Obstetrics & Gynecology

## 2023-06-01 VITALS — BP 113/70 | HR 99 | Wt 184.0 lb

## 2023-06-01 DIAGNOSIS — O0993 Supervision of high risk pregnancy, unspecified, third trimester: Secondary | ICD-10-CM | POA: Diagnosis not present

## 2023-06-01 DIAGNOSIS — O0990 Supervision of high risk pregnancy, unspecified, unspecified trimester: Secondary | ICD-10-CM

## 2023-06-01 DIAGNOSIS — Z3A36 36 weeks gestation of pregnancy: Secondary | ICD-10-CM

## 2023-06-01 DIAGNOSIS — O99013 Anemia complicating pregnancy, third trimester: Secondary | ICD-10-CM | POA: Diagnosis not present

## 2023-06-01 DIAGNOSIS — O10019 Pre-existing essential hypertension complicating pregnancy, unspecified trimester: Secondary | ICD-10-CM

## 2023-06-01 DIAGNOSIS — O365930 Maternal care for other known or suspected poor fetal growth, third trimester, not applicable or unspecified: Secondary | ICD-10-CM

## 2023-06-01 DIAGNOSIS — D509 Iron deficiency anemia, unspecified: Secondary | ICD-10-CM

## 2023-06-01 NOTE — Progress Notes (Deleted)
ROB

## 2023-06-01 NOTE — Progress Notes (Signed)
PRENATAL VISIT NOTE  Subjective:  Janet Villanueva is a 28 y.o. G2P0010 at [redacted]w[redacted]d being seen today for ongoing prenatal care.  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  Patient reports occasional contractions.  Contractions: Irregular. Vag. Bleeding: None.  Movement: Present. Denies leaking of fluid.   The following portions of the patient's history were reviewed and updated as appropriate: allergies, current medications, past family history, past medical history, past social history, past surgical history and problem list.   Objective:   Vitals:   06/01/23 1016  BP: 113/70  Pulse: 99  Weight: 184 lb (83.5 kg)    Fetal Status: Fetal Heart Rate (bpm): 154   Movement: Present  Presentation: Vertex  General:  Alert, oriented and cooperative. Patient is in no acute distress.  Skin: Skin is warm and dry. No rash noted.   Cardiovascular: Normal heart rate noted  Respiratory: Normal respiratory effort, no problems with respiration noted  Abdomen: Soft, gravid, appropriate for gestational age.  Pain/Pressure: Present     Pelvic: Cervical exam performed in the presence of a chaperone Dilation: 1 Effacement (%): Thick Station: Ballotable, cultures obtained.  Extremities: Normal range of motion.  Edema: None  Mental Status: Normal mood and affect. Normal behavior. Normal judgment and thought content.   [place] MFM FETAL BPP WO NON STRESS Result Date:  05/21/2023 ----------------------------------------------------------------------  OBSTETRICS REPORT                       (Signed Final 05/21/2023 04:27 pm) ---------------------------------------------------------------------- Patient Info  ID #:       [id number]                          D.O.B.:  [birth year] (27 yrs)(F)  Name:       Janet Villanueva                  Visit Date: 05/21/2023 03:36 pm              [place] ---------------------------------------------------------------------- Performed By  Attending:        Ma Rings MD         Ref. Address:     [address]                                                             [address]  Performed By:     Tommie Raymond BS,       [facility]                    RDMS, RVT                                [facility]  Referred By:      [facility] ---------------------------------------------------------------------- Orders  #  Description                           Code        Ordered By  1  [place] MFM FETAL BPP WO NON               [id number]    YU FANG     STRESS ----------------------------------------------------------------------  #  Order #                     Accession #                Episode #  1  [id number]                   [id number]                 [id number] ---------------------------------------------------------------------- Indications  Hypertension - Chronic/Pre-existing            O10.019  (labetalol)  Polyhydramnios, third trimester, antepartum    O40.3XX0  condition or complication, unspecified fetus  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 Cord Insertion:      Previously seen  Amniotic Fluid  AFI FV:      Within normal limits  AFI Sum(cm)     %Tile       Largest Pocket(cm)  20.83           78          6.82  RUQ(cm)       RLQ(cm)       LUQ(cm)        LLQ(cm)  5.53          4.24          6.82           4.24 ---------------------------------------------------------------------- Biophysical Evaluation  Amniotic F.V:   Within normal limits       F. Tone:        Observed  F. Movement:    Observed                   Score:          8/8  F. Breathing:   Observed ---------------------------------------------------------------------- OB History  Gravidity:    2         Term:   0  Prem:   0        SAB:   1  TOP:          0       Ectopic:  0        Living: 0 ---------------------------------------------------------------------- Gestational Age  LMP:           34w 1d        Date:  09/24/22                   EDD:   07/01/23  Best:          34w 1d     Det. By:  LMP  (09/24/22)          EDD:   07/01/23 ---------------------------------------------------------------------- Anatomy  Ventricles:            Appears normal         Stomach:                Appears normal, left                                                                        sided  Thoracic:              Appears normal         Kidneys:                Appear normal  Heart:                 Appears normal         Bladder:                Appears normal                         (4CH, axis, and                         situs)  Diaphragm:             Appears normal ---------------------------------------------------------------------- Cervix Uterus Adnexa  Cervix  Not visualized (advanced GA >24wks)  Uterus  No abnormality visualized.  Right Ovary  Within normal limits.  Left Ovary  Within normal limits.  Cul De Sac  No free fluid seen.  Adnexa  No  abnormality visualized ---------------------------------------------------------------------- Comments  This patient was seen for a BPP due to chronic hypertension  treated with labetalol.  She denies any problems since her  last exam.  A biophysical profile performed today was 8/8.  The AFI was 20.83 cm (within normal limits).  She should continue weekly fetal testing until delivery.  She will return in 1 week for an NST or BPP. ----------------------------------------------------------------------                   Ma Rings, MD Electronically Signed Final Report   05/21/2023 04:27 pm ----------------------------------------------------------------------   [place] MFM FETAL BPP WO NON STRESS Result Date: 05/14/2023 ----------------------------------------------------------------------  OBSTETRICS REPORT                       (Signed Final 05/14/2023 04:01 pm) ---------------------------------------------------------------------- Patient Info  ID #:  [id number]                          D.O.B.:  [birth year] (27 yrs)(F)  Name:       Janet Villanueva                  Visit Date: 05/14/2023 03:28 pm              [place] ---------------------------------------------------------------------- Performed By  Attending:        Noralee Space MD        Ref. Address:     [address]                                                             [address]  Performed By:     Reinaldo Raddle            Location[facility]                    RDMS                                     [facility] at                                                             [facility]  Referred By:      [facility] ---------------------------------------------------------------------- Orders  #  Description                           Code        Ordered By  1  [place] MFM FETAL BPP WO NON               95621.30    YU FANG     STRESS  ----------------------------------------------------------------------  #  Order #                     Accession #                Episode #  1  [id number]                   [id number]                 [id number] ---------------------------------------------------------------------- Indications  Hypertension - Chronic/Pre-existing            O10.019  (labetalol)  Polyhydramnios, third trimester, antepartum    O40.3XX0  condition or complication, unspecified fetus  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 Cord Insertion:      Previously seen  Amniotic Fluid  AFI FV:      Within normal limits  AFI Sum(cm)     %Tile       Largest Pocket(cm)  16.78           61          5.14  RUQ(cm)       RLQ(cm)       LUQ(cm)        LLQ(cm)  4.7           3.26          3.68           5.14 ---------------------------------------------------------------------- Biophysical Evaluation  Amniotic F.V:   Within normal limits       F. Tone:        Observed  F. Movement:    Observed                   Score:          8/8  F. Breathing:   Observed ---------------------------------------------------------------------- OB History  Gravidity:    2         Term:   0        Prem:   0        SAB:   1  TOP:          0       Ectopic:  0        Living: 0 ---------------------------------------------------------------------- Gestational Age  LMP:           33w 1d        Date:  09/24/22                   EDD:   07/01/23  Best:          33w 1d     Det. By:  LMP  (09/24/22)          EDD:   07/01/23 ----------------------------------------------------------------------  Anatomy  Heart:                 Appears normal         Kidneys:                Appear normal                         (4CH, axis, and                         situs)  Diaphragm:             Appears normal         Bladder:                Appears normal  Stomach:  Appears normal, left                         sided ---------------------------------------------------------------------- Impression  Amniotic fluid is normal and good fetal activity is seen  .Antenatal testing is reassuring. BPP 8/8. Cephalic  presentation. ---------------------------------------------------------------------- Recommendations  -Continue weekly BPP till delivery. ----------------------------------------------------------------------                 Noralee Space, MD Electronically Signed Final Report   05/14/2023 04:01 pm ----------------------------------------------------------------------   [place] MFM FETAL BPP WO NON STRESS Result Date: 05/07/2023 ----------------------------------------------------------------------  OBSTETRICS REPORT                       (Signed Final 05/07/2023 04:16 pm) ---------------------------------------------------------------------- Patient Info  ID #:       [id number]                          D.O.B.:  [birth year] (27 yrs)(F)  Name:       Janet Villanueva                  Visit Date: 05/07/2023 03:21 pm              [place] ---------------------------------------------------------------------- Performed By  Attending:        Braxton Feathers DO       Ref. Address:     [address]                                                             [address]  Performed By:     Tommie Raymond BS,       Location:         [facility]                    RDMS, RVT                                [facility] at                                                             [facility]  Referred By:      [facility]  ---------------------------------------------------------------------- Orders  #  Description                           Code        Ordered By  1  [place] MFM FETAL BPP WO NON               19147.82    YU  FANG     STRESS  2  [place] MFM OB FOLLOW UP                   [id number]    YU FANG ----------------------------------------------------------------------  #  Order #                     Accession #                Episode #  1  [id number]                   [id number]                 [id number]  2  [id number]                   [id number]                 [id number] ---------------------------------------------------------------------- Indications  Hypertension - Chronic/Pre-existing            O10.019  (labetalol)  Polyhydramnios, third trimester, antepartum    O40.3XX0  condition or complication, unspecified fetus  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 Cord Insertion:      Previously seen  Amniotic Fluid  AFI FV:      Within normal limits  AFI Sum(cm)     %Tile       Largest Pocket(cm)  18.38           68          7.7  RUQ(cm)       RLQ(cm)       LUQ(cm)        LLQ(cm)  4.28          3.04          7.7            3.36 ---------------------------------------------------------------------- Biophysical Evaluation  Amniotic F.V:   Within normal limits       F. Tone:        Observed  F. Movement:    Observed                   Score:          8/8  F. Breathing:   Observed ---------------------------------------------------------------------- Biometry  BPD:      79.6  mm     G.  Age:  32w 0d         35  %    CI:        74.78   %    70 - 86  FL/HC:      19.5   %    19.1 - 21.3  HC:      292.1  mm     G. Age:  32w 1d         16  %    HC/AC:      1.08        0.96 - 1.17  AC:      270.4  mm     G. Age:  31w 1d         21  %    FL/BPD:     71.6   %    71 - 87  FL:         57  mm     G. Age:  29w 6d        2.2  %    FL/AC:      21.1   %    20 - 24  LV:        3.3  mm  Est. FW:    1664  gm    3 lb 11 oz      10  % ---------------------------------------------------------------------- OB History  Gravidity:    2         Term:   0        Prem:   0        SAB:   1  TOP:          0       Ectopic:  0        Living: 0 ---------------------------------------------------------------------- Gestational Age  LMP:           32w 1d        Date:  09/24/22                   EDD:   07/01/23  U/S Today:     31w 2d                                        EDD:   07/07/23  Best:          32w 1d     Det. By:  LMP  (09/24/22)          EDD:   07/01/23 ---------------------------------------------------------------------- Anatomy  Cranium:               Previously seen        Aortic Arch:            Previously seen  Cavum:                 Previously seen        Ductal Arch:            Previously seen  Ventricles:            Appears normal         Diaphragm:              Appears normal  Choroid Plexus:        Previously seen        Stomach:                Appears normal, left  sided  Cerebellum:            Previously seen        Abdomen:                Previously seen  Posterior Fossa:       Previously seen        Abdominal Wall:         Previously seen  Face:                  Orbits and profile     Cord Vessels:           Previously seen                         previously seen  Lips:                  Previously seen        Kidneys:                Appear normal  Thoracic:              Previously seen         Bladder:                Appears normal  Heart:                 Appears normal         Spine:                  Previously seen                         (4CH, axis, and                         situs)  RVOT:                  Previously seen        Upper Extremities:      Previously seen  LVOT:                  Previously seen        Lower Extremities:      Previously seen ---------------------------------------------------------------------- Cervix Uterus Adnexa  Cervix  Normal appearance by transabdominal scan  Uterus  No abnormality visualized.  Right Ovary  Within normal limits.  Left Ovary  Within normal limits.  Cul De Sac  No free fluid seen.  Adnexa  No abnormality visualized ---------------------------------------------------------------------- Comments  The patient is here for a follow-up BPP and growth ultrasound  at 32w 1d for [facility] and lower end of normal growth. EDD:  07/01/2023 dated by LMP  (09/24/22). She has no concerns  today.  Sonographic findings  Single intrauterine pregnancy.  Fetal cardiac activity: Observed.  Presentation: Breech.  Interval fetal anatomy appears normal.  Fetal biometry shows the estimated fetal weight at the 10  percentile and the abdominal circumference at the 21  percentile.  Amniotic fluid: Within normal limits.  MVP: 7.7 cm.  Placenta: Anterior.  BPP: 8/8.  Recommendations  1. Weekly antenatal testing until delivery  2. Growth ultrasounds every 3-4 weeks ----------------------------------------------------------------------                  Braxton Feathers, DO Electronically Signed Final Report   05/07/2023 04:16 pm ----------------------------------------------------------------------   [place] MFM OB FOLLOW UP  Result Date: 05/07/2023 ----------------------------------------------------------------------  OBSTETRICS REPORT                       (Signed Final 05/07/2023 04:16 pm) ---------------------------------------------------------------------- Patient Info  ID #:        [id number]                          D.O.B.:  09-19-1995 (27 yrs)(F)  Name:       Janet Villanueva                  Visit Date: 05/07/2023 03:21 pm              [place] ---------------------------------------------------------------------- Performed By  Attending:        Braxton Feathers DO       Ref. Address:     [address]                                                             [address]  Performed By:     Tommie Raymond BS,       Location[facility]                    RDMS, RVT                                [facility] at                                                             [facility]  Referred By:      [facility] ---------------------------------------------------------------------- Orders  #  Description                           Code        Ordered By  1  [place] MFM FETAL BPP WO NON               76819.01    YU FANG     STRESS  2  [place] MFM OB FOLLOW UP                   95284.13    YU FANG ----------------------------------------------------------------------  #  Order #                     Accession #                Episode #  1  [id number]  [id number]                 [id number]  2  [id number]                   [id number]                 [id number] ---------------------------------------------------------------------- Indications  Hypertension - Chronic/Pre-existing            O10.019  (labetalol)  Polyhydramnios, third trimester, antepartum    O40.3XX0  condition or complication, unspecified fetus  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 Cord Insertion:      Previously seen  Amniotic Fluid  AFI FV:      Within normal limits  AFI Sum(cm)     %Tile       Largest Pocket(cm)  18.38           68          7.7  RUQ(cm)       RLQ(cm)       LUQ(cm)        LLQ(cm)  4.28          3.04          7.7            3.36 ---------------------------------------------------------------------- Biophysical Evaluation  Amniotic F.V:   Within normal limits       F. Tone:        Observed  F. Movement:    Observed                   Score:          8/8  F. Breathing:   Observed ---------------------------------------------------------------------- Biometry  BPD:      79.6  mm     G. Age:  32w 0d         35  %    CI:        74.78   %    70 - 86                                                          FL/HC:      19.5   %    19.1 - 21.3  HC:      292.1  mm     G. Age:  32w 1d         16  %    HC/AC:      1.08        0.96 - 1.17  AC:      270.4  mm  G. Age:  31w 1d         21  %    FL/BPD:     71.6   %    71 - 87  FL:         57  mm     G. Age:  29w 6d        2.2  %    FL/AC:      21.1   %    20 - 24  LV:        3.3  mm  Est. FW:    1664  gm    3 lb 11 oz      10  % ---------------------------------------------------------------------- OB History  Gravidity:    2         Term:   0        Prem:   0        SAB:   1  TOP:          0       Ectopic:  0        Living: 0 ---------------------------------------------------------------------- Gestational Age  LMP:           32w 1d        Date:  09/24/22                   EDD:   07/01/23  U/S Today:     31w 2d                                        EDD:   07/07/23  Best:          32w 1d     Det. By:  LMP  (09/24/22)          EDD:   07/01/23 ---------------------------------------------------------------------- Anatomy  Cranium:               Previously seen        Aortic Arch:            Previously seen  Cavum:                 Previously seen         Ductal Arch:            Previously seen  Ventricles:            Appears normal         Diaphragm:              Appears normal  Choroid Plexus:        Previously seen        Stomach:                Appears normal, left                                                                        sided  Cerebellum:            Previously seen        Abdomen:  Previously seen  Posterior Fossa:       Previously seen        Abdominal Wall:         Previously seen  Face:                  Orbits and profile     Cord Vessels:           Previously seen                         previously seen  Lips:                  Previously seen        Kidneys:                Appear normal  Thoracic:              Previously seen        Bladder:                Appears normal  Heart:                 Appears normal         Spine:                  Previously seen                         (4CH, axis, and                         situs)  RVOT:                  Previously seen        Upper Extremities:      Previously seen  LVOT:                  Previously seen        Lower Extremities:      Previously seen ---------------------------------------------------------------------- Cervix Uterus Adnexa  Cervix  Normal appearance by transabdominal scan  Uterus  No abnormality visualized.  Right Ovary  Within normal limits.  Left Ovary  Within normal limits.  Cul De Sac  No free fluid seen.  Adnexa  No abnormality visualized ---------------------------------------------------------------------- Comments  The patient is here for a follow-up BPP and growth ultrasound  at 32w 1d for [facility] and lower end of normal growth. EDD:  07/01/2023 dated by LMP  (09/24/22). She has no concerns  today.  Sonographic findings  Single intrauterine pregnancy.  Fetal cardiac activity: Observed.  Presentation: Breech.  Interval fetal anatomy appears normal.  Fetal biometry shows the estimated fetal weight at the 10  percentile and the abdominal circumference at the 21   percentile.  Amniotic fluid: Within normal limits.  MVP: 7.7 cm.  Placenta: Anterior.  BPP: 8/8.  Recommendations  1. Weekly antenatal testing until delivery  2. Growth ultrasounds every 3-4 weeks ----------------------------------------------------------------------                  Braxton Feathers, DO Electronically Signed Final Report   05/07/2023 04:16 pm ----------------------------------------------------------------------    Assessment and Plan:  Pregnancy: G2P0010 at [redacted]w[redacted]d 1. IUGR (intrauterine growth restriction) affecting care of mother, third trimester, not applicable or unspecified fetus (Primary) EFW 10%, will follow up scans ans antenatal testing as per MFM, also follow their delivery recommendations.  2. Essential hypertension in pregnancy Stable BP on Labetalol 200 mg po bid, continue ASA.  CMP checked today. Already in antenatal testing. - Comprehensive metabolic panel  3. Maternal iron deficiency anemia affecting pregnancy in third trimester, antepartum She received IV iron, will follow up labs. - CBC - Ferritin  4. [redacted] weeks gestation of pregnancy 5. Supervision of high risk pregnancy, antepartum - Cervicovaginal ancillary only - Strep Gp B NAA Pelvic cultures done, will follow up results and manage accordingly. Preterm labor symptoms and general obstetric precautions including but not limited to vaginal bleeding, contractions, leaking of fluid and fetal movement were reviewed in detail with the patient. Please refer to After Visit Summary for other counseling recommendations.   Return in about 1 week (around 06/08/2023) for OFFICE OB VISIT (MD only).  Future Appointments  Date Time Provider Department Center  06/04/2023  7:15 AM [facility] NURSE [facility]  06/04/2023  7:30 AM [facility] US6 [facility]  06/08/2023  4:10 PM [place] Bing, MD [facility]  06/11/2023  7:30 AM [facility] US5 [facility]  06/15/2023  4:10 PM Maybelline Kolarik, Jethro Bastos, MD [facility]   06/22/2023  4:10 PM Shaquana Buel, Jethro Bastos, MD [facility]  06/29/2023  4:10 PM Lennart Pall, MD [facility]    Jaynie Collins, MD

## 2023-06-01 NOTE — Patient Instructions (Signed)

## 2023-06-02 ENCOUNTER — Encounter: Payer: Self-pay | Admitting: Obstetrics & Gynecology

## 2023-06-02 LAB — CBC
Hematocrit: 36 % (ref 34.0–46.6)
Hemoglobin: 11.7 g/dL (ref 11.1–15.9)
MCH: 28 pg (ref 26.6–33.0)
MCHC: 32.5 g/dL (ref 31.5–35.7)
MCV: 86 fL (ref 79–97)
Platelets: 301 10*3/uL (ref 150–450)
RBC: 4.18 x10E6/uL (ref 3.77–5.28)
RDW: 20.4 % — ABNORMAL HIGH (ref 11.7–15.4)
WBC: 6.5 10*3/uL (ref 3.4–10.8)

## 2023-06-02 LAB — COMPREHENSIVE METABOLIC PANEL
ALT: 18 [IU]/L (ref 0–32)
AST: 17 [IU]/L (ref 0–40)
Albumin: 3.7 g/dL — ABNORMAL LOW (ref 4.0–5.0)
Alkaline Phosphatase: 136 [IU]/L — ABNORMAL HIGH (ref 44–121)
BUN/Creatinine Ratio: 6 — ABNORMAL LOW (ref 9–23)
BUN: 6 mg/dL (ref 6–20)
Bilirubin Total: 0.5 mg/dL (ref 0.0–1.2)
CO2: 19 mmol/L — ABNORMAL LOW (ref 20–29)
Calcium: 9.9 mg/dL (ref 8.7–10.2)
Chloride: 102 mmol/L (ref 96–106)
Creatinine, Ser: 0.95 mg/dL (ref 0.57–1.00)
Globulin, Total: 2.8 g/dL (ref 1.5–4.5)
Glucose: 82 mg/dL (ref 70–99)
Potassium: 4.2 mmol/L (ref 3.5–5.2)
Sodium: 135 mmol/L (ref 134–144)
Total Protein: 6.5 g/dL (ref 6.0–8.5)
eGFR: 84 mL/min/{1.73_m2} (ref >59)

## 2023-06-02 LAB — FERRITIN: Ferritin: 113 ng/mL (ref 15–150)

## 2023-06-02 LAB — CERVICOVAGINAL ANCILLARY ONLY
Chlamydia: NEGATIVE
Comment: NEGATIVE
Comment: NORMAL
Neisseria Gonorrhea: NEGATIVE

## 2023-06-03 LAB — STREP GP B NAA: Strep Gp B NAA: POSITIVE — AB

## 2023-06-04 ENCOUNTER — Ambulatory Visit: Payer: Managed Care, Other (non HMO) | Attending: Obstetrics | Admitting: Obstetrics

## 2023-06-04 ENCOUNTER — Ambulatory Visit: Payer: Managed Care, Other (non HMO) | Admitting: *Deleted

## 2023-06-04 ENCOUNTER — Other Ambulatory Visit: Payer: Self-pay | Admitting: Obstetrics and Gynecology

## 2023-06-04 ENCOUNTER — Encounter: Payer: Self-pay | Admitting: Obstetrics and Gynecology

## 2023-06-04 ENCOUNTER — Encounter: Payer: Self-pay | Admitting: Obstetrics & Gynecology

## 2023-06-04 ENCOUNTER — Ambulatory Visit: Payer: Managed Care, Other (non HMO) | Attending: Obstetrics and Gynecology

## 2023-06-04 VITALS — BP 139/70 | HR 86

## 2023-06-04 DIAGNOSIS — O10013 Pre-existing essential hypertension complicating pregnancy, third trimester: Secondary | ICD-10-CM | POA: Diagnosis not present

## 2023-06-04 DIAGNOSIS — O10913 Unspecified pre-existing hypertension complicating pregnancy, third trimester: Secondary | ICD-10-CM

## 2023-06-04 DIAGNOSIS — O0990 Supervision of high risk pregnancy, unspecified, unspecified trimester: Secondary | ICD-10-CM | POA: Diagnosis present

## 2023-06-04 DIAGNOSIS — O409XX0 Polyhydramnios, unspecified trimester, not applicable or unspecified: Secondary | ICD-10-CM

## 2023-06-04 DIAGNOSIS — O365930 Maternal care for other known or suspected poor fetal growth, third trimester, not applicable or unspecified: Secondary | ICD-10-CM | POA: Insufficient documentation

## 2023-06-04 DIAGNOSIS — O99820 Streptococcus B carrier state complicating pregnancy: Secondary | ICD-10-CM | POA: Insufficient documentation

## 2023-06-04 DIAGNOSIS — O1213 Gestational proteinuria, third trimester: Secondary | ICD-10-CM | POA: Insufficient documentation

## 2023-06-04 DIAGNOSIS — O403XX0 Polyhydramnios, third trimester, not applicable or unspecified: Secondary | ICD-10-CM | POA: Diagnosis not present

## 2023-06-04 DIAGNOSIS — Z3A36 36 weeks gestation of pregnancy: Secondary | ICD-10-CM | POA: Diagnosis not present

## 2023-06-04 DIAGNOSIS — O99013 Anemia complicating pregnancy, third trimester: Secondary | ICD-10-CM

## 2023-06-04 DIAGNOSIS — O10019 Pre-existing essential hypertension complicating pregnancy, unspecified trimester: Secondary | ICD-10-CM

## 2023-06-04 DIAGNOSIS — D649 Anemia, unspecified: Secondary | ICD-10-CM

## 2023-06-04 NOTE — Procedures (Signed)
Janet Villanueva [birth year] [redacted]w[redacted]d  Fetus A Non-Stress Test Interpretation for 06/04/23- NST with BPP  Indication: IUGR  Fetal Heart Rate A Mode: External Baseline Rate (A): 145 bpm Variability: Moderate Accelerations: 15 x 15 Decelerations: Variable Multiple birth?: No  Uterine Activity Mode: Toco Contraction Frequency (min): none Resting Tone Palpated: Relaxed  Interpretation (Fetal Testing) Nonstress Test Interpretation: Reactive Comments: tracing reviewed byDr. Parke Poisson

## 2023-06-04 NOTE — Progress Notes (Signed)
MFM Consult Note  Janet Villanueva is currently at 36 weeks and 1 day.  Janet Villanueva has been followed due to chronic hypertension treated with labetalol.    Janet Villanueva denies any problems since her last exam and reports feeling fetal movements throughout the day.    On today's exam, the EFW of 4 pounds 13 ounces measures at the 3rd  percentile for her gestational age indicating IUGR.  The fetus has grown over 1 pound over the past 3 weeks.    The total AFI was 19.43 cm (within normal limits).  A BPP performed today was 10 out of 10 with a reactive NST.  Doppler studies of the umbilical arteries showed a normal S/D ratio of 2.39 .  There were no signs of absent or reversed end-diastolic flow.    Due to IUGR, delivery is recommended at between 37 to 38 weeks.    The patient will discuss scheduling an induction with you during her prenatal visit next week.    Janet Villanueva will return next week for another BPP.    The patient stated that all of her questions were answered today.  A total of 20 minutes was spent counseling and coordinating the care for this patient.  Greater than 50% of the time was spent in direct face-to-face contact.

## 2023-06-08 ENCOUNTER — Encounter: Payer: Self-pay | Admitting: Obstetrics and Gynecology

## 2023-06-08 ENCOUNTER — Ambulatory Visit: Payer: Managed Care, Other (non HMO) | Admitting: Obstetrics and Gynecology

## 2023-06-08 VITALS — BP 126/81 | HR 101 | Wt 186.0 lb

## 2023-06-08 DIAGNOSIS — O365930 Maternal care for other known or suspected poor fetal growth, third trimester, not applicable or unspecified: Secondary | ICD-10-CM

## 2023-06-08 DIAGNOSIS — O10019 Pre-existing essential hypertension complicating pregnancy, unspecified trimester: Secondary | ICD-10-CM | POA: Diagnosis not present

## 2023-06-08 DIAGNOSIS — O99013 Anemia complicating pregnancy, third trimester: Secondary | ICD-10-CM

## 2023-06-08 DIAGNOSIS — Z3A36 36 weeks gestation of pregnancy: Secondary | ICD-10-CM

## 2023-06-08 DIAGNOSIS — O0990 Supervision of high risk pregnancy, unspecified, unspecified trimester: Secondary | ICD-10-CM

## 2023-06-08 DIAGNOSIS — O99820 Streptococcus B carrier state complicating pregnancy: Secondary | ICD-10-CM

## 2023-06-08 DIAGNOSIS — D509 Iron deficiency anemia, unspecified: Secondary | ICD-10-CM

## 2023-06-08 NOTE — Progress Notes (Unsigned)
ROB  CC: Elevated B/P today. Frequent HA's ,no visual changes had episode of Right sided upper abdominal pain this past weekend pt doing ok today.  Note staying on track with taking B/P Rx. Reading at home have been WNL.

## 2023-06-09 ENCOUNTER — Encounter: Payer: Self-pay | Admitting: Obstetrics and Gynecology

## 2023-06-09 ENCOUNTER — Encounter (HOSPITAL_COMMUNITY): Payer: Self-pay

## 2023-06-09 ENCOUNTER — Encounter (HOSPITAL_COMMUNITY): Payer: Self-pay | Admitting: *Deleted

## 2023-06-09 ENCOUNTER — Other Ambulatory Visit: Payer: Self-pay | Admitting: Obstetrics and Gynecology

## 2023-06-09 ENCOUNTER — Telehealth (HOSPITAL_COMMUNITY): Payer: Self-pay | Admitting: *Deleted

## 2023-06-09 DIAGNOSIS — O365930 Maternal care for other known or suspected poor fetal growth, third trimester, not applicable or unspecified: Secondary | ICD-10-CM

## 2023-06-09 LAB — COMPREHENSIVE METABOLIC PANEL
ALT: 18 [IU]/L (ref 0–32)
AST: 19 [IU]/L (ref 0–40)
Albumin: 3.9 g/dL — ABNORMAL LOW (ref 4.0–5.0)
Alkaline Phosphatase: 150 [IU]/L — ABNORMAL HIGH (ref 44–121)
BUN/Creatinine Ratio: 9 (ref 9–23)
BUN: 8 mg/dL (ref 6–20)
Bilirubin Total: 0.5 mg/dL (ref 0.0–1.2)
CO2: 19 mmol/L — ABNORMAL LOW (ref 20–29)
Calcium: 10.4 mg/dL — ABNORMAL HIGH (ref 8.7–10.2)
Chloride: 104 mmol/L (ref 96–106)
Creatinine, Ser: 0.86 mg/dL (ref 0.57–1.00)
Globulin, Total: 3.1 g/dL (ref 1.5–4.5)
Glucose: 75 mg/dL (ref 70–99)
Potassium: 4.3 mmol/L (ref 3.5–5.2)
Sodium: 140 mmol/L (ref 134–144)
Total Protein: 7 g/dL (ref 6.0–8.5)
eGFR: 94 mL/min/{1.73_m2} (ref >59)

## 2023-06-09 LAB — CBC
Hematocrit: 36.3 % (ref 34.0–46.6)
Hemoglobin: 11.8 g/dL (ref 11.1–15.9)
MCH: 28.4 pg (ref 26.6–33.0)
MCHC: 32.5 g/dL (ref 31.5–35.7)
MCV: 87 fL (ref 79–97)
Platelets: 275 10*3/uL (ref 150–450)
RBC: 4.16 x10E6/uL (ref 3.77–5.28)
RDW: 20.4 % — ABNORMAL HIGH (ref 11.7–15.4)
WBC: 6.7 10*3/uL (ref 3.4–10.8)

## 2023-06-09 NOTE — Telephone Encounter (Signed)
 Preadmission screen

## 2023-06-10 NOTE — Progress Notes (Signed)
   PRENATAL VISIT NOTE  Subjective:  Janet Villanueva is a 28 y.o. G2P0010 at [redacted]w[redacted]d being seen today for ongoing prenatal care.  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  Patient reports no complaints.  Contractions: Not present. Vag. Bleeding: None.  Movement: Present. Denies leaking of fluid.   The following portions of the patient's history were reviewed and updated as appropriate: allergies, current medications, past family history, past medical history, past social history, past surgical history and problem list.   Objective:   Vitals:   06/08/23 1605 06/08/23 1612  BP: (!) 147/80 126/81  Pulse: 96 (!) 101  Weight: 186 lb (84.4 kg)     Fetal Status: Fetal Heart Rate (bpm): 141   Movement: Present     General:  Alert, oriented and cooperative. Patient is in no acute distress.  Skin: Skin is warm and dry. No rash noted.   Cardiovascular: Normal heart rate noted  Respiratory: Normal respiratory effort, no problems with respiration noted  Abdomen: Soft, gravid, appropriate for gestational age.  Pain/Pressure: Absent     Pelvic: Cervical exam deferred        Extremities: Normal range of motion.  Edema: None  Mental Status: Normal mood and affect. Normal behavior. Normal judgment and thought content.   Assessment and Plan:  Pregnancy: G2P0010 at 109w5d 1. Essential hypertension in pregnancy (Primary) Continue on labetalol 200 bid and  low dose asa. Pre-eclampsia labs; patient already with baseline proteinuria  2. [redacted] weeks gestation of pregnancy  3. Group B Streptococcus carrier, +RV culture, currently pregnant Treat in labor  4. IUGR (intrauterine growth restriction) affecting care of mother, third trimester, not applicable or unspecified fetus 37wk IOL recommended and patient amenable to this. Patient set up for 2/21 at 2345 2/14 3.1%, 2172g, ac 1.1%, afi 19.4, cephalic, bpp 10/10, UA dopplers wnl  5. Maternal iron deficiency anemia affecting pregnancy in third trimester, antepartum Resolved. S/p IV iron    Latest Ref Rng & Units 06/08/2023    4:13 PM 06/01/2023   12:09 PM 04/23/2023   10:40 AM  CBC  WBC 3.4 - 10.8 x10E3/uL 6.7  6.5  8.7   Hemoglobin 11.1 - 15.9 g/dL 16.1  09.6  9.1   Hematocrit 34.0 - 46.6 % 36.3  36.0  29.1   Platelets 150 - 450 x10E3/uL 275  301  332    6. Supervision of high risk pregnancy, antepartum  Preterm labor symptoms and general obstetric precautions including but not limited to vaginal bleeding, contractions, leaking of fluid and fetal movement were reviewed in detail with the patient. Please refer to After Visit Summary for other counseling recommendations.   Return in about 2 weeks (around 06/22/2023) for bp check.postpartum  Future Appointments  Date Time Provider Department Center  06/11/2023  7:30 AM [facility] US5 [facility]  06/12/2023 12:00 AM MC-LD SCHED ROOM [facility]IN[facility] None  06/22/2023  2:50 PM [facility] NURSE [facility]  07/20/2023  3:10 PM Anyanwu, Jethro Bastos, MD [facility]    [place] Bing, MD

## 2023-06-11 ENCOUNTER — Ambulatory Visit: Payer: Managed Care, Other (non HMO)

## 2023-06-11 ENCOUNTER — Other Ambulatory Visit: Payer: Self-pay | Admitting: Obstetrics

## 2023-06-11 ENCOUNTER — Ambulatory Visit: Payer: Managed Care, Other (non HMO) | Admitting: *Deleted

## 2023-06-11 ENCOUNTER — Other Ambulatory Visit: Payer: Self-pay

## 2023-06-11 DIAGNOSIS — O99013 Anemia complicating pregnancy, third trimester: Secondary | ICD-10-CM

## 2023-06-11 DIAGNOSIS — O403XX0 Polyhydramnios, third trimester, not applicable or unspecified: Secondary | ICD-10-CM | POA: Diagnosis not present

## 2023-06-11 DIAGNOSIS — O365930 Maternal care for other known or suspected poor fetal growth, third trimester, not applicable or unspecified: Secondary | ICD-10-CM

## 2023-06-11 DIAGNOSIS — O10013 Pre-existing essential hypertension complicating pregnancy, third trimester: Secondary | ICD-10-CM

## 2023-06-11 DIAGNOSIS — D649 Anemia, unspecified: Secondary | ICD-10-CM

## 2023-06-11 DIAGNOSIS — Z3A37 37 weeks gestation of pregnancy: Secondary | ICD-10-CM

## 2023-06-11 DIAGNOSIS — Z148 Genetic carrier of other disease: Secondary | ICD-10-CM

## 2023-06-11 NOTE — Procedures (Signed)
Janet Villanueva 1995-10-04 [redacted]w[redacted]d  Fetus A Non-Stress Test Interpretation for 06/11/23-NST with BPP  Indication: IUGR  Fetal Heart Rate A Mode: External Baseline Rate (A): 150 bpm Variability: Moderate Accelerations: 15 x 15 Decelerations: Variable Multiple birth?: No  Uterine Activity Mode: Toco Contraction Frequency (min): irreg Contraction Duration (sec): 50-80 Contraction Quality: Mild Resting Tone Palpated: Relaxed  Interpretation (Fetal Testing) Nonstress Test Interpretation: Reactive Comments: Tracing reviewed byDr. Parke Poisson

## 2023-06-12 ENCOUNTER — Inpatient Hospital Stay (HOSPITAL_COMMUNITY): Payer: Managed Care, Other (non HMO)

## 2023-06-13 ENCOUNTER — Inpatient Hospital Stay (HOSPITAL_COMMUNITY)
Admission: RE | Admit: 2023-06-13 | Discharge: 2023-06-16 | DRG: 807 | Disposition: A | Discharge status: Home / Self Care | Payer: Managed Care, Other (non HMO) | Attending: Obstetrics & Gynecology | Admitting: Obstetrics & Gynecology

## 2023-06-13 ENCOUNTER — Encounter (HOSPITAL_COMMUNITY): Payer: Self-pay | Admitting: Obstetrics and Gynecology

## 2023-06-13 DIAGNOSIS — O114 Pre-existing hypertension with pre-eclampsia, complicating childbirth: Principal | ICD-10-CM | POA: Diagnosis present

## 2023-06-13 DIAGNOSIS — Z833 Family history of diabetes mellitus: Secondary | ICD-10-CM | POA: Diagnosis not present

## 2023-06-13 DIAGNOSIS — D509 Iron deficiency anemia, unspecified: Secondary | ICD-10-CM | POA: Diagnosis present

## 2023-06-13 DIAGNOSIS — O119 Pre-existing hypertension with pre-eclampsia, unspecified trimester: Secondary | ICD-10-CM | POA: Diagnosis present

## 2023-06-13 DIAGNOSIS — Z7982 Long term (current) use of aspirin: Secondary | ICD-10-CM

## 2023-06-13 DIAGNOSIS — Z3A37 37 weeks gestation of pregnancy: Secondary | ICD-10-CM

## 2023-06-13 DIAGNOSIS — O99824 Streptococcus B carrier state complicating childbirth: Secondary | ICD-10-CM | POA: Diagnosis present

## 2023-06-13 DIAGNOSIS — O1002 Pre-existing essential hypertension complicating childbirth: Secondary | ICD-10-CM | POA: Diagnosis present

## 2023-06-13 DIAGNOSIS — O1092 Unspecified pre-existing hypertension complicating childbirth: Secondary | ICD-10-CM | POA: Diagnosis present

## 2023-06-13 DIAGNOSIS — O0990 Supervision of high risk pregnancy, unspecified, unspecified trimester: Secondary | ICD-10-CM

## 2023-06-13 DIAGNOSIS — Z8759 Personal history of other complications of pregnancy, childbirth and the puerperium: Secondary | ICD-10-CM

## 2023-06-13 DIAGNOSIS — O4593 Premature separation of placenta, unspecified, third trimester: Secondary | ICD-10-CM | POA: Diagnosis present

## 2023-06-13 DIAGNOSIS — O365930 Maternal care for other known or suspected poor fetal growth, third trimester, not applicable or unspecified: Secondary | ICD-10-CM | POA: Diagnosis present

## 2023-06-13 DIAGNOSIS — Z8249 Family history of ischemic heart disease and other diseases of the circulatory system: Secondary | ICD-10-CM | POA: Diagnosis not present

## 2023-06-13 DIAGNOSIS — O99820 Streptococcus B carrier state complicating pregnancy: Secondary | ICD-10-CM | POA: Diagnosis not present

## 2023-06-13 DIAGNOSIS — O1404 Mild to moderate pre-eclampsia, complicating childbirth: Secondary | ICD-10-CM | POA: Diagnosis not present

## 2023-06-13 DIAGNOSIS — O4590 Premature separation of placenta, unspecified, unspecified trimester: Secondary | ICD-10-CM | POA: Diagnosis present

## 2023-06-13 DIAGNOSIS — Z148 Genetic carrier of other disease: Secondary | ICD-10-CM

## 2023-06-13 DIAGNOSIS — O10019 Pre-existing essential hypertension complicating pregnancy, unspecified trimester: Principal | ICD-10-CM

## 2023-06-13 DIAGNOSIS — O9902 Anemia complicating childbirth: Secondary | ICD-10-CM | POA: Diagnosis present

## 2023-06-13 DIAGNOSIS — O365990 Maternal care for other known or suspected poor fetal growth, unspecified trimester, not applicable or unspecified: Secondary | ICD-10-CM

## 2023-06-13 LAB — COMPREHENSIVE METABOLIC PANEL
ALT: 19 U/L (ref 0–44)
AST: 19 U/L (ref 15–41)
Albumin: 2.8 g/dL — ABNORMAL LOW (ref 3.5–5.0)
Alkaline Phosphatase: 112 U/L (ref 38–126)
Anion gap: 8 (ref 5–15)
BUN: 5 mg/dL — ABNORMAL LOW (ref 6–20)
CO2: 23 mmol/L (ref 22–32)
Calcium: 10.2 mg/dL (ref 8.9–10.3)
Chloride: 106 mmol/L (ref 98–111)
Creatinine, Ser: 0.81 mg/dL (ref 0.44–1.00)
GFR, Estimated: >60 mL/min (ref >60)
Glucose, Bld: 77 mg/dL (ref 70–99)
Potassium: 4.2 mmol/L (ref 3.5–5.1)
Sodium: 137 mmol/L (ref 135–145)
Total Bilirubin: 0.9 mg/dL (ref 0.0–1.2)
Total Protein: 6.5 g/dL (ref 6.5–8.1)

## 2023-06-13 LAB — CBC
HCT: 35.4 % — ABNORMAL LOW (ref 36.0–46.0)
Hemoglobin: 11.4 g/dL — ABNORMAL LOW (ref 12.0–15.0)
MCH: 28.4 pg (ref 26.0–34.0)
MCHC: 32.2 g/dL (ref 30.0–36.0)
MCV: 88.1 fL (ref 80.0–100.0)
Platelets: 261 10*3/uL (ref 150–400)
RBC: 4.02 MIL/uL (ref 3.87–5.11)
RDW: 22.1 % — ABNORMAL HIGH (ref 11.5–15.5)
WBC: 6.8 10*3/uL (ref 4.0–10.5)
nRBC: 0 % (ref 0.0–0.2)

## 2023-06-13 LAB — PROTEIN / CREATININE RATIO, URINE
Creatinine, Urine: 60 mg/dL
Protein Creatinine Ratio: 0.33 mg/mg{creat} — ABNORMAL HIGH (ref 0.00–0.15)
Total Protein, Urine: 20 mg/dL

## 2023-06-13 MED ORDER — SOD CITRATE-CITRIC ACID 500-334 MG/5ML PO SOLN: 30.0000 mL | ORAL | Status: DC | PRN

## 2023-06-13 MED ORDER — SODIUM CHLORIDE 0.9 % IV SOLN
5.0000 10*6.[IU] | Freq: Once | INTRAVENOUS | Status: AC
  Administered 2023-06-13: 5 10*6.[IU] via INTRAVENOUS
  Filled 2023-06-13: qty 5

## 2023-06-13 MED ORDER — LACTATED RINGERS IV SOLN: 500.0000 mL | INTRAVENOUS | Status: DC | PRN

## 2023-06-13 MED ORDER — PENICILLIN G POT IN DEXTROSE 60000 UNIT/ML IV SOLN
3.0000 10*6.[IU] | INTRAVENOUS | Status: DC
  Administered 2023-06-13 – 2023-06-14 (×3): 3 10*6.[IU] via INTRAVENOUS
  Filled 2023-06-13 (×4): qty 50

## 2023-06-13 MED ORDER — ACETAMINOPHEN 325 MG PO TABS: 650.0000 mg | ORAL_TABLET | ORAL | Status: DC | PRN

## 2023-06-13 MED ORDER — OXYTOCIN BOLUS FROM INFUSION
333.0000 mL | Freq: Once | INTRAVENOUS | Status: AC
  Administered 2023-06-14: 333 mL via INTRAVENOUS

## 2023-06-13 MED ORDER — OXYTOCIN-SODIUM CHLORIDE 30-0.9 UT/500ML-% IV SOLN
2.5000 [IU]/h | INTRAVENOUS | Status: DC
  Administered 2023-06-14: 2.5 [IU]/h via INTRAVENOUS

## 2023-06-13 MED ORDER — TERBUTALINE SULFATE 1 MG/ML IJ SOLN
0.2500 mg | Freq: Once | INTRAMUSCULAR | Status: DC | PRN
  Filled 2023-06-13: qty 1

## 2023-06-13 MED ORDER — LIDOCAINE HCL (PF) 1 % IJ SOLN: 30.0000 mL | INTRAMUSCULAR | Status: DC | PRN

## 2023-06-13 MED ORDER — FENTANYL CITRATE (PF) 100 MCG/2ML IJ SOLN
50.0000 ug | INTRAMUSCULAR | Status: AC | PRN
  Administered 2023-06-14 (×2): 50 ug via INTRAVENOUS
  Filled 2023-06-13 (×2): qty 2

## 2023-06-13 MED ORDER — MISOPROSTOL 25 MCG QUARTER TABLET
25.0000 ug | ORAL_TABLET | ORAL | Status: DC | PRN
  Administered 2023-06-13: 25 ug via VAGINAL
  Filled 2023-06-13: qty 1

## 2023-06-13 MED ORDER — ONDANSETRON HCL 4 MG/2ML IJ SOLN
4.0000 mg | Freq: Four times a day (QID) | INTRAMUSCULAR | Status: DC | PRN
  Administered 2023-06-14: 4 mg via INTRAVENOUS
  Filled 2023-06-13: qty 2

## 2023-06-13 MED ORDER — LACTATED RINGERS IV SOLN: INTRAVENOUS | Status: DC

## 2023-06-13 NOTE — Progress Notes (Signed)
 Janet Villanueva is a 28 y.o. G2P0010 at [redacted]w[redacted]d by LMP admitted for induction of labor due to FGR, EFW 3.1%.  Subjective: Patient reports feeling cramping with recent placement of foley balloon. She denies any other concerns at this time.  Objective: BP 136/65   Pulse 89   Temp 98.7 F (37.1 C) (Oral)   Resp 17   Ht 5\' 5"  (1.651 m)   Wt 85 kg   LMP 09/24/2022   BMI 31.18 kg/m     FHT:  FHR: 135 bpm, variability: moderate,  accelerations:  Present,  decelerations:  Absent UC:   irregular, every 1-6 minutes SVE:   Dilation: 1 Effacement (%): 50 Station: -3 Exam by:: Dr Leanora Cover  Labs: Lab Results  Component Value Date   WBC 6.8 06/13/2023   HGB 11.4 (L) 06/13/2023   HCT 35.4 (L) 06/13/2023   MCV 88.1 06/13/2023   PLT 261 06/13/2023    Assessment / Plan: G2P0010 at [redacted]w[redacted]d Induction of labor due to FGR. Cat 1 FHT.   Labor:  FB still in place.  Preeclampsia:  CHTN, current PCR 0.33. Other labs ok. No signs or symptoms of toxicity (known chronic elevated PCR). Fetal Wellbeing:  Category I Pain Control:   IV pain medication offered PRN. I/D:   GBS + > PCN Anticipated MOD:  NSVD  Elige Ko, Student-MidWife 06/13/2023, 9:17 PM

## 2023-06-13 NOTE — H&P (Signed)
 OBSTETRIC ADMISSION HISTORY AND PHYSICAL  Janet Villanueva is a 28 y.o. female G2P0010 with IUP at [redacted]w[redacted]d by 10 week [place] presenting for IOL for CHTN and FGR 3%. She reports +FMs, No LOF, no VB, no blurry vision, headaches or peripheral edema, and RUQ pain.  She plans on breast feeding. She is undecided for birth control. She received her prenatal care at  [facility]    Dating: By 10 week [place] --->  Estimated Date of Delivery: 07/01/23  Sono:   @[redacted]w[redacted]d , CWD, normal anatomy, cephalic presentation, anterior placental lie, 2172 gm 4 lb 13 oz 3.1 % EFW   Prenatal History/Complications:  - CHTN, Labetalol 200mg  BID; Urine pro/cr 0.33 - GBS positive, PCN - IUGR - Carrier of spinal muscular atrophy - IDA resolved; Hb 11.4 - Hypercalcemia, Ca 10.2 - BV (12/6)  Past Medical History: 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   FOB screening offered    Hypertension    IBS (irritable bowel syndrome) 05/20/2015   Seasonal allergic rhinitis 05/30/2021    Past Surgical History: Past Surgical History:  Procedure Laterality Date   LAPAROSCOPIC OVARIAN CYSTECTOMY Left 12/11/2022   Procedure: LAPAROSCOPIC OVARIAN CYSTECTOMY AND DETORSION;  Surgeon: Christeen Douglas, MD;  Location: [facility] ORS;  Service: Gynecology;  Laterality: Left;   LAPAROSCOPY Left 12/11/2022   Procedure: LAPAROSCOPY DIAGNOSTIC;  Surgeon: Christeen Douglas, MD;  Location: [facility] ORS;  Service: Gynecology;  Laterality: Left;   TONSILLECTOMY     TONSILLECTOMY      Obstetrical History: OB History     Gravida  2   Para  0   Term  0   Preterm  0   AB  1   Living  0      SAB  1   IAB  0   Ectopic  0   Multiple  0   Live Births              Social History Social History   Socioeconomic History   Marital status: Single    Spouse name: Not on file   Number of children: Not on file   Years of education: Not on file   Highest education level: Not  on file  Occupational History   Occupation: fight attendant  Tobacco Use   Smoking status: Never   Smokeless tobacco: Never  Vaping Use   Vaping status: Never Used  Substance and Sexual Activity   Alcohol use: Not Currently    Comment: social   Drug use: No   Sexual activity: Not Currently    Partners: Male    Birth control/protection: None  Other Topics Concern   Not on file  Social History Narrative   Not on file   Social Drivers of Health   Financial Resource Strain: Not on file  Food Insecurity: No Food Insecurity (06/13/2023)   Hunger Vital Sign    Worried About Running Out of Food in the Last Year: Never true    Ran Out of Food in the Last Year: Never true  Transportation Needs: No Transportation Needs (06/13/2023)   PRAPARE - Transportation    Lack of Transportation (Medical): No    Lack of Transportation (Non-Medical): No  Physical Activity: Not on file  Stress: Not on file  Social Connections: Not on file    Family History: Family History  Problem Relation Age of Onset   Cancer Maternal Grandmother  breast   Diabetes Maternal Grandmother    Hypertension Maternal Grandfather    Thyroid disease Mother    Asthma Sister    Asthma Brother     Allergies: No Known Allergies  Medications Prior to Admission  Medication Sig Dispense Refill Last Dose/Taking   aspirin 81 MG chewable tablet Chew 2 tablets (162 mg total) by mouth daily. 180 tablet 3 06/13/2023   famotidine (PEPCID) 20 MG tablet Take 1 tablet (20 mg total) by mouth 2 (two) times daily. 60 tablet 3 06/13/2023   labetalol (NORMODYNE) 200 MG tablet Take 1 tablet (200 mg total) by mouth 2 (two) times daily. 60 tablet 3 06/13/2023   Prenatal Vit-Fe Fumarate-FA (PRENATAL MULTIVITAMIN) TABS tablet Take 1 tablet by mouth daily at 12 noon.   06/12/2023     Review of Systems   All systems reviewed and negative except as stated in HPI  Blood pressure 136/76, pulse 86, resp. rate 17, height 5\' 5"  (1.651  m), weight 85 kg, last menstrual period 09/24/2022, unknown if currently breastfeeding. General appearance: alert, cooperative, and appears stated age Lungs: clear to auscultation bilaterally Heart: regular rate and rhythm Abdomen: soft, non-tender; bowel sounds normal Pelvic: adequate, unproven Extremities: Homans sign is negative, no sign of DVT DTR's 2+ Presentation: cephalic Fetal monitoringBaseline: 130 bpm, Variability: Good {> 6 bpm), Accelerations: Reactive, and Decelerations: Absent Uterine activityFrequency: Every 1-6 minutes Dilation: 1 Effacement (%): 50 Station: -3 Exam by:: [employer] RN   Prenatal labs: ABO, Rh: --/--/B POS (02/23 1344) Antibody: NEG (02/23 1344) Rubella: 1.41 (08/13 1343) RPR: Non Reactive (12/26 0906)  HBsAg: Negative (08/13 1343)  HIV: Non Reactive (12/26 0906)  GBS: Positive/-- (02/11 1100)    Lab Results  Component Value Date   GBS Positive (A) 06/01/2023   GTT 75/137/100; normal Genetic screening  Low Risk NIPS, Negative AFP  Anatomy US normal  Immunization History  Administered Date(s) Administered   DTaP 01/08/1995, 03/11/1995, 05/10/1995   HIB (PRP-OMP) 01/08/1995, 03/11/1995, 05/10/1995   HPV Quadrivalent 10/11/2012, 12/30/2012   Hepatitis B 1994/11/23, 01/08/1995, 08/17/1995   IPV 01/08/1995, 03/11/1995, 05/10/1995   Influenza, Seasonal, Injecte, Preservative Fre 04/24/2023   Influenza,inj,Quad PF,6+ Mos 02/28/2013, 02/24/2018   MMR 12/01/1995, 08/28/2016   Moderna Covid-19 Vaccine Bivalent Booster 41yrs & up 08/08/2021   Moderna Sars-Covid-2 Vaccination 06/26/2019   Tdap 10/11/2012, 04/27/2023   Varicella 12/01/1995    Prenatal Transfer Tool  Maternal Diabetes: No Genetic Screening: INCREASED CARRIER RISK for Spinal Muscular Atrophy  Maternal Ultrasounds/Referrals: Normal Fetal Ultrasounds or other Referrals:  None Maternal Substance Abuse:  No Significant Maternal Medications:  None Significant Maternal Lab Results:  Group B Strep positive Number of Prenatal Visits:greater than 3 verified prenatal visits Maternal Vaccinations:TDap and Covid Other Comments:  None   Results for orders placed or performed during the hospital encounter of 06/13/23 (from the past 24 hours)  Type and screen   Collection Time: 06/13/23  1:44 PM  Result Value Ref Range   ABO/RH(D) B POS    Antibody Screen NEG    Sample Expiration      06/16/2023,2359 Performed at [facility], [address], [place], [place] [address]   CBC   Collection Time: 06/13/23  1:54 PM  Result Value Ref Range   WBC 6.8 4.0 - 10.5 K/uL   RBC 4.02 3.87 - 5.11 MIL/uL   Hemoglobin 11.4 (L) 12.0 - 15.0 g/dL   HCT 78.2 (L) 95.6 - 21.3 %   MCV 88.1 80.0 - 100.0  fL   MCH 28.4 26.0 - 34.0 pg   MCHC 32.2 30.0 - 36.0 g/dL   RDW 16.1 (H) 09.6 - 04.5 %   Platelets 261 150 - 400 K/uL   nRBC 0.0 0.0 - 0.2 %  Comprehensive metabolic panel   Collection Time: 06/13/23  1:54 PM  Result Value Ref Range   Sodium 137 135 - 145 mmol/L   Potassium 4.2 3.5 - 5.1 mmol/L   Chloride 106 98 - 111 mmol/L   CO2 23 22 - 32 mmol/L   Glucose, Bld 77 70 - 99 mg/dL   BUN 5 (L) 6 - 20 mg/dL   Creatinine, Ser 4.09 0.44 - 1.00 mg/dL   Calcium 81.1 8.9 - 91.4 mg/dL   Total Protein 6.5 6.5 - 8.1 g/dL   Albumin 2.8 (L) 3.5 - 5.0 g/dL   AST 19 15 - 41 U/L   ALT 19 0 - 44 U/L   Alkaline Phosphatase 112 38 - 126 U/L   Total Bilirubin 0.9 0.0 - 1.2 mg/dL   GFR, Estimated >78 >29 mL/min   Anion gap 8 5 - 15    Patient Active Problem List   Diagnosis Date Noted   IUGR (intrauterine growth restriction) affecting care of mother 06/13/2023   Hypercalcemia 06/09/2023   Group B Streptococcus carrier, +RV culture, currently pregnant 06/04/2023   Maternal iron deficiency anemia affecting pregnancy in third trimester, antepartum 04/16/2023   IUGR (intrauterine growth restriction) affecting care of mother, third trimester, not applicable or unspecified fetus 03/17/2023    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 G2P0010 at [redacted]w[redacted]d here for IOL for [facility] w/FGR  #Labor: Verbal consent granted for FB, inserted with 40ml saline, will given buccal cytotec if contractions space greater than 5 minutes. #Pain: Pt aware of options #FWB: Cat I #GBS status:  Positive, PCN #Feeding: Breastmilk  #Reproductive Life planning: Undecided #Circ:  yes  Wyn Forster, MD  06/13/2023, 5:12 PM

## 2023-06-14 ENCOUNTER — Other Ambulatory Visit: Payer: Self-pay

## 2023-06-14 ENCOUNTER — Inpatient Hospital Stay (HOSPITAL_COMMUNITY): Payer: Managed Care, Other (non HMO) | Admitting: Anesthesiology

## 2023-06-14 ENCOUNTER — Encounter (HOSPITAL_COMMUNITY): Payer: Self-pay | Admitting: Obstetrics and Gynecology

## 2023-06-14 DIAGNOSIS — O99820 Streptococcus B carrier state complicating pregnancy: Secondary | ICD-10-CM | POA: Diagnosis not present

## 2023-06-14 DIAGNOSIS — Z3A37 37 weeks gestation of pregnancy: Secondary | ICD-10-CM

## 2023-06-14 DIAGNOSIS — O1404 Mild to moderate pre-eclampsia, complicating childbirth: Secondary | ICD-10-CM | POA: Diagnosis not present

## 2023-06-14 DIAGNOSIS — O4593 Premature separation of placenta, unspecified, third trimester: Secondary | ICD-10-CM | POA: Diagnosis not present

## 2023-06-14 DIAGNOSIS — O365930 Maternal care for other known or suspected poor fetal growth, third trimester, not applicable or unspecified: Secondary | ICD-10-CM | POA: Diagnosis not present

## 2023-06-14 LAB — TYPE AND SCREEN
ABO/RH(D): B POS
Antibody Screen: NEGATIVE

## 2023-06-14 LAB — CBC
HCT: 36.3 % (ref 36.0–46.0)
Hemoglobin: 11.8 g/dL — ABNORMAL LOW (ref 12.0–15.0)
MCH: 28.3 pg (ref 26.0–34.0)
MCHC: 32.5 g/dL (ref 30.0–36.0)
MCV: 87.1 fL (ref 80.0–100.0)
Platelets: 250 10*3/uL (ref 150–400)
RBC: 4.17 MIL/uL (ref 3.87–5.11)
RDW: 22.3 % — ABNORMAL HIGH (ref 11.5–15.5)
WBC: 10.5 10*3/uL (ref 4.0–10.5)
nRBC: 0 % (ref 0.0–0.2)

## 2023-06-14 LAB — RPR: RPR Ser Ql: NONREACTIVE

## 2023-06-14 MED ORDER — ACETAMINOPHEN 500 MG PO TABS
1000.0000 mg | ORAL_TABLET | Freq: Three times a day (TID) | ORAL | Status: DC
  Administered 2023-06-14 – 2023-06-16 (×6): 1000 mg via ORAL
  Filled 2023-06-14 (×6): qty 2

## 2023-06-14 MED ORDER — DIPHENHYDRAMINE HCL 25 MG PO CAPS: 25.0000 mg | ORAL_CAPSULE | Freq: Four times a day (QID) | ORAL | Status: DC | PRN

## 2023-06-14 MED ORDER — OXYTOCIN-SODIUM CHLORIDE 30-0.9 UT/500ML-% IV SOLN
1.0000 m[IU]/min | INTRAVENOUS | Status: DC
  Administered 2023-06-14: 2 m[IU]/min via INTRAVENOUS
  Filled 2023-06-14: qty 500

## 2023-06-14 MED ORDER — TERBUTALINE SULFATE 1 MG/ML IJ SOLN: 0.2500 mg | Freq: Once | INTRAMUSCULAR | Status: DC | PRN

## 2023-06-14 MED ORDER — PHENYLEPHRINE 80 MCG/ML (10ML) SYRINGE FOR IV PUSH (FOR BLOOD PRESSURE SUPPORT): 80.0000 ug | PREFILLED_SYRINGE | INTRAVENOUS | Status: DC | PRN

## 2023-06-14 MED ORDER — LIDOCAINE-EPINEPHRINE (PF) 1.5 %-1:200000 IJ SOLN
INTRAMUSCULAR | Status: DC | PRN
Start: 2023-06-14 — End: 2023-06-14
  Administered 2023-06-14: 5 mL via EPIDURAL

## 2023-06-14 MED ORDER — MEDROXYPROGESTERONE ACETATE 150 MG/ML IM SUSP: 150.0000 mg | INTRAMUSCULAR | Status: DC | PRN

## 2023-06-14 MED ORDER — OXYCODONE HCL 5 MG PO TABS: 5.0000 mg | ORAL_TABLET | Freq: Four times a day (QID) | ORAL | Status: DC | PRN

## 2023-06-14 MED ORDER — PRENATAL MULTIVITAMIN CH
1.0000 | ORAL_TABLET | Freq: Every day | ORAL | Status: DC
  Administered 2023-06-14 – 2023-06-15 (×2): 1 via ORAL
  Filled 2023-06-14 (×2): qty 1

## 2023-06-14 MED ORDER — ONDANSETRON HCL 4 MG/2ML IJ SOLN: 4.0000 mg | INTRAMUSCULAR | Status: DC | PRN

## 2023-06-14 MED ORDER — POTASSIUM CHLORIDE CRYS ER 20 MEQ PO TBCR
20.0000 meq | EXTENDED_RELEASE_TABLET | Freq: Once | ORAL | Status: AC
  Administered 2023-06-14: 20 meq via ORAL

## 2023-06-14 MED ORDER — SENNOSIDES-DOCUSATE SODIUM 8.6-50 MG PO TABS
2.0000 | ORAL_TABLET | Freq: Every day | ORAL | Status: DC
  Administered 2023-06-15 – 2023-06-16 (×2): 2 via ORAL
  Filled 2023-06-14 (×2): qty 2

## 2023-06-14 MED ORDER — EPHEDRINE 5 MG/ML INJ: 10.0000 mg | INTRAVENOUS | Status: DC | PRN

## 2023-06-14 MED ORDER — TRANEXAMIC ACID-NACL 1000-0.7 MG/100ML-% IV SOLN
INTRAVENOUS | Status: AC
  Administered 2023-06-14: 1000 mg via INTRAVENOUS
  Filled 2023-06-14: qty 100

## 2023-06-14 MED ORDER — DIBUCAINE (PERIANAL) 1 % EX OINT: 1.0000 | TOPICAL_OINTMENT | CUTANEOUS | Status: DC | PRN

## 2023-06-14 MED ORDER — BENZOCAINE-MENTHOL 20-0.5 % EX AERO
1.0000 | INHALATION_SPRAY | CUTANEOUS | Status: DC | PRN
  Administered 2023-06-14: 1 via TOPICAL
  Filled 2023-06-14: qty 56

## 2023-06-14 MED ORDER — POTASSIUM CHLORIDE CRYS ER 20 MEQ PO TBCR
20.0000 meq | EXTENDED_RELEASE_TABLET | Freq: Once | ORAL | Status: DC
  Filled 2023-06-14: qty 1

## 2023-06-14 MED ORDER — DIPHENHYDRAMINE HCL 50 MG/ML IJ SOLN: 12.5000 mg | INTRAMUSCULAR | Status: DC | PRN

## 2023-06-14 MED ORDER — SIMETHICONE 80 MG PO CHEW: 80.0000 mg | CHEWABLE_TABLET | ORAL | Status: DC | PRN

## 2023-06-14 MED ORDER — FUROSEMIDE 40 MG PO TABS: 40.0000 mg | ORAL_TABLET | Freq: Every day | ORAL | Status: DC

## 2023-06-14 MED ORDER — WITCH HAZEL-GLYCERIN EX PADS: 1.0000 | MEDICATED_PAD | CUTANEOUS | Status: DC | PRN

## 2023-06-14 MED ORDER — LABETALOL HCL 200 MG PO TABS
200.0000 mg | ORAL_TABLET | Freq: Two times a day (BID) | ORAL | Status: DC
  Administered 2023-06-14 – 2023-06-16 (×5): 200 mg via ORAL
  Filled 2023-06-14 (×5): qty 1

## 2023-06-14 MED ORDER — ONDANSETRON HCL 4 MG PO TABS: 4.0000 mg | ORAL_TABLET | ORAL | Status: DC | PRN

## 2023-06-14 MED ORDER — COCONUT OIL OIL: 1.0000 | TOPICAL_OIL | Status: DC | PRN

## 2023-06-14 MED ORDER — TRANEXAMIC ACID-NACL 1000-0.7 MG/100ML-% IV SOLN: 1000.0000 mg | INTRAVENOUS | Status: AC

## 2023-06-14 MED ORDER — ZOLPIDEM TARTRATE 5 MG PO TABS: 5.0000 mg | ORAL_TABLET | Freq: Every evening | ORAL | Status: DC | PRN

## 2023-06-14 MED ORDER — FENTANYL-BUPIVACAINE-NACL 0.5-0.125-0.9 MG/250ML-% EP SOLN
12.0000 mL/h | EPIDURAL | Status: DC | PRN
  Administered 2023-06-14: 12 mL/h via EPIDURAL
  Filled 2023-06-14: qty 250

## 2023-06-14 MED ORDER — IBUPROFEN 800 MG PO TABS
800.0000 mg | ORAL_TABLET | Freq: Three times a day (TID) | ORAL | Status: DC
  Administered 2023-06-14 – 2023-06-16 (×6): 800 mg via ORAL
  Filled 2023-06-14 (×6): qty 1

## 2023-06-14 MED ORDER — LACTATED RINGERS IV SOLN
500.0000 mL | Freq: Once | INTRAVENOUS | Status: AC
  Administered 2023-06-14: 500 mL via INTRAVENOUS

## 2023-06-14 MED ORDER — OXYCODONE HCL 5 MG PO TABS: 10.0000 mg | ORAL_TABLET | Freq: Four times a day (QID) | ORAL | Status: DC | PRN

## 2023-06-14 MED ORDER — FENTANYL CITRATE (PF) 100 MCG/2ML IJ SOLN
50.0000 ug | INTRAMUSCULAR | Status: DC | PRN
  Administered 2023-06-14 (×2): 100 ug via INTRAVENOUS
  Filled 2023-06-14 (×2): qty 2

## 2023-06-14 NOTE — Progress Notes (Signed)
 BP 129/74   Pulse 79   Temp 98.7 F (37.1 C) (Oral)   Resp 17   Ht 5\' 5"  (1.651 m)   Wt 85 kg   LMP 09/24/2022   BMI 31.18 kg/m   Dilation: 4 Effacement (%): 70 Cervical Position: Middle Station: -2 Presentation: Vertex Exam by:: Quin Hoop, Student CNM  S/P foley balloon. R/B of AROM discussed with patient and she agreed with the plan. AROM performed, clear fluid. Starting low dose pitocin. Epidural upon request. Cat 1 FHT

## 2023-06-14 NOTE — Lactation Note (Signed)
 This note was copied from a baby's chart. Lactation Consultation Note  Patient Name: Boy Adilee Lemme WGNFA'O Date: 06/14/2023 Age:29 hours   Attempted to visit with family but [facility] RN Candise Bowens was working with Ms. Davene Costain and asked this LC to come back later. She'll be setting up a DEBP, LC services will follow up at a later time for initial assessment.   Cassandria Drew S Hayes Rehfeldt 06/14/2023, 3:57 PM

## 2023-06-14 NOTE — Anesthesia Procedure Notes (Signed)
 Epidural Patient location during procedure: OB Start time: 06/14/2023 7:13 AM End time: 06/14/2023 7:17 AM  Staffing Anesthesiologist: Atilano Median, DO Performed: anesthesiologist   Preanesthetic Checklist Completed: patient identified, IV checked, site marked, risks and benefits discussed, surgical consent, monitors and equipment checked, pre-op evaluation and timeout performed  Epidural Patient position: sitting Prep: ChloraPrep Patient monitoring: heart rate, continuous pulse ox and blood pressure Approach: midline Location: L4-L5 Injection technique: LOR saline  Needle:  Needle type: Tuohy  Needle gauge: 17 G Needle length: 9 cm Needle insertion depth: 7 cm Catheter type: closed end flexible Catheter size: 20 Guage Catheter at skin depth: 12 cm Test dose: negative and 1.5% lidocaine with Epi 1:200 K  Assessment Events: blood not aspirated, no cerebrospinal fluid, injection not painful, no injection resistance and no paresthesia  Additional Notes Patient identified. Risks/Benefits/Options discussed with patient including but not limited to bleeding, infection, nerve damage, paralysis, failed block, incomplete pain control, headache, blood pressure changes, nausea, vomiting, reactions to medications, itching and postpartum back pain. Confirmed with bedside nurse the patient's most recent platelet count. Confirmed with patient that they are not currently taking any anticoagulation, have any bleeding history or any family history of bleeding disorders. Patient expressed understanding and wished to proceed. All questions were answered. Sterile technique was used throughout the entire procedure. Please see nursing notes for vital signs. Test dose was given through epidural catheter and negative prior to continuing to dose epidural or start infusion. Warning signs of high block given to the patient including shortness of breath, tingling/numbness in hands, complete motor block,  or any concerning symptoms with instructions to call for help. Patient was given instructions on fall risk and not to get out of bed. All questions and concerns addressed with instructions to call with any issues or inadequate analgesia.    Reason for block:procedure for pain

## 2023-06-14 NOTE — Progress Notes (Signed)
 BP 110/83   Pulse 98   Temp 98.7 F (37.1 C) (Oral)   Resp 17   Ht 5\' 5"  (1.651 m)   Wt 85 kg   LMP 09/24/2022   BMI 31.18 kg/m     RN called CNM at [phone] for prolonged deceleration. We arrived to patient room around 0728. RNs assisted patient to hands and knees. Frank bright red vaginal bleeding noted. SVE noted to be 10/100/+2 and FSE applied by SNM. FHR confirmed in the 50s-60s. Dr. Vergie Living called for assistance at 0732. Arrived at bedside at [phone] for delivery.  Janet Villanueva, Student-MidWife 06/14/2023, 8:14 AM

## 2023-06-14 NOTE — Anesthesia Preprocedure Evaluation (Signed)
 Anesthesia Evaluation  Patient identified by MRN, date of birth, ID band Patient awake    Reviewed: Allergy & Precautions, NPO status , Patient's Chart, lab work & pertinent test results  Airway Mallampati: II  TM Distance: >3 FB Neck ROM: Full    Dental no notable dental hx.    Pulmonary neg pulmonary ROS   Pulmonary exam normal        Cardiovascular hypertension,  Rhythm:Regular Rate:Normal     Neuro/Psych negative neurological ROS  negative psych ROS   GI/Hepatic negative GI ROS, Neg liver ROS,,,  Endo/Other  negative endocrine ROS    Renal/GU negative Renal ROS  negative genitourinary   Musculoskeletal   Abdominal Normal abdominal exam  (+)   Peds  Hematology  (+) Blood dyscrasia, anemia   Anesthesia Other Findings   Reproductive/Obstetrics (+) Pregnancy                             Anesthesia Physical Anesthesia Plan  ASA: 2  Anesthesia Plan: Epidural   Post-op Pain Management:    Induction:   PONV Risk Score and Plan: Treatment may vary due to age or medical condition  Airway Management Planned: Natural Airway  Additional Equipment: None  Intra-op Plan:   Post-operative Plan:   Informed Consent: I have reviewed the patients History and Physical, chart, labs and discussed the procedure including the risks, benefits and alternatives for the proposed anesthesia with the patient or authorized representative who has indicated his/her understanding and acceptance.     Dental advisory given  Plan Discussed with:   Anesthesia Plan Comments:        Anesthesia Quick Evaluation

## 2023-06-15 ENCOUNTER — Encounter: Payer: BC Managed Care – PPO | Admitting: Obstetrics & Gynecology

## 2023-06-15 MED ORDER — FUROSEMIDE 20 MG PO TABS
20.0000 mg | ORAL_TABLET | Freq: Every day | ORAL | Status: DC
  Administered 2023-06-15 – 2023-06-16 (×2): 20 mg via ORAL
  Filled 2023-06-15 (×2): qty 1

## 2023-06-15 MED ORDER — POTASSIUM CHLORIDE CRYS ER 20 MEQ PO TBCR
20.0000 meq | EXTENDED_RELEASE_TABLET | Freq: Every day | ORAL | Status: DC
  Administered 2023-06-15 – 2023-06-16 (×2): 20 meq via ORAL
  Filled 2023-06-15 (×2): qty 1

## 2023-06-15 NOTE — Progress Notes (Signed)

## 2023-06-15 NOTE — Lactation Note (Signed)
 This note was copied from a baby's chart.  NICU Lactation Consultation Note  Patient Name: Janet Villanueva Date: 06/15/2023 Age:29 hours  Reason for consult: NICU baby; Primapara; 1st time breastfeeding; Infant < 6lbs; Early term 37-38.6wks; Other (Comment) (cHTN)  SUBJECTIVE Visited with family of 69 61/8 weeks old NICU female; baby "Chosen" was admitted due to desaturations and grunting. Ms. Janet Villanueva is a P1 and already had a pump set up in the room but hasn't had the chance to start pumping yet. Assisted with hand expression, breast massage, provided a pumping band in size "S/M" for hands on pumping and set up a washing/drying station; she initiated pumping at 27 hours post-partum during [facility] consult, praised her for her efforts. Reviewed pumping schedule, pumping log, lactogenesis II/III CDC and anticipatory guidelines.   OBJECTIVE Infant data: Mother's Current Feeding Choice: Breast Milk  O2 Device: CPAP O2 Flow Rate (L/min): 4 L/min FiO2 (%): 25 %  Infant feeding assessment IDFTS - Readiness: 5 (CPAP, accepts paci)   Maternal data: G2P1011 Vaginal, Vacuum (Extractor) Has patient been taught Hand Expression?: Yes Significant Breast History:: (+) breast changes during the pregnancy Current breast feeding challenges:: NICU admission Does the patient have breastfeeding experience prior to this delivery?: No Pumping frequency: initiated pumping at 27 hours post-partum Pumped volume: 0 mL Flange Size: 21; 24 (#21 onb R and # 24 on L side (had nipple piecing scar on L side)) Hands-free pumping top sizes: Small/Medium (Blue) Risk factor for low/delayed milk supply:: primipara, prematurity, PPH of 580 cc, IUGR, < 6 lbs  Pump: Personal, Hands Free (MomCozy wearable pump (through insurance))  ASSESSMENT Infant: Feeding Status: Scheduled 8-11-2-5 Feeding method: Tube/Gavage (Bolus)  Maternal: Milk volume: Normal  INTERVENTIONS/PLAN Interventions: Interventions: Breast  feeding basics reviewed; Coconut oil; DEBP; Education; Breast massage; Hand express; LC Services brochure; CDC Guidelines for Breast Pump Cleaning; NICU Pumping Log Tools: Pump; Flanges; Coconut oil; Hands-free pumping top Pump Education: Setup, frequency, and cleaning; Milk Storage  Plan: STS whenever possible Massage and hand express both breasts prior and after pumping; coconut oil prior pumping for lubrication Pump both breasts on initiation mode every 3 hours for 15 minutes; ideally 8 pumping sessions/24 hours  MGM and auntie present and supportive. All questions and concerns answered, family to contact [facility] services PRN.  Consult Status: NICU follow-up NICU Follow-up type: New admission follow up   Stephana Morell Venetia Constable 06/15/2023, 11:37 AM

## 2023-06-15 NOTE — Anesthesia Postprocedure Evaluation (Signed)
 Anesthesia Post Note  Patient: Janet Villanueva  Procedure(s) Performed: AN AD HOC LABOR EPIDURAL     Patient location during evaluation: Mother Baby Anesthesia Type: Epidural Level of consciousness: awake and alert and oriented Pain management: satisfactory to patient Vital Signs Assessment: post-procedure vital signs reviewed and stable Respiratory status: respiratory function stable Cardiovascular status: stable Postop Assessment: no headache, no backache, epidural receding, patient able to bend at knees, no signs of nausea or vomiting, adequate PO intake and able to ambulate Anesthetic complications: no   No notable events documented.  Last Vitals:  Vitals:   06/15/23 0053 06/15/23 0612  BP: 108/60 122/68  Pulse: 91 80  Resp: 16 18  Temp: 36.8 C 37.1 C  SpO2: 100% 100%    Last Pain:  Vitals:   06/15/23 0612  TempSrc: Oral  PainSc:    Pain Goal:                   Erasmo Vertz

## 2023-06-15 NOTE — Progress Notes (Addendum)
 Post Partum Day 1 s/p VAVD Subjective: no complaints, up ad lib, voiding, and tolerating PO  Objective: Blood pressure 125/85, pulse (!) 101, temperature 98.4 F (36.9 C), temperature source Oral, resp. rate 18, height 5\' 5"  (1.651 m), weight 85 kg, last menstrual period 09/24/2022, SpO2 99%, unknown if currently breastfeeding.  Physical Exam:  General: alert, cooperative, and no distress Lochia: appropriate Uterine Fundus: firm DVT Evaluation: No evidence of DVT seen on physical exam.  Recent Labs    06/13/23 1354 06/14/23 0636  HGB 11.4* 11.8*  HCT 35.4* 36.3    Assessment/Plan: Postpartum - Contraception: undecided, would like to discuss at [facility] appt - MOF: NICU; breast - Rh status: Rh+ - Rubella status: RI - Dispo: anticipate discharge home PPD2  Neonatal - Doing well in NICU - Circumcision: desired - patient prefers outpatient circ after baby is discharged from NICU Attending Circumcision Counseling Progress Note Patient desires circumcision for her female infant.  Circumcision procedure details discussed, risks and benefits of procedure were also discussed.  These include but are not limited to: Benefits of circumcision in men include reduction in the rates of urinary tract infection (UTI), penile cancer, some sexually transmitted infections, penile inflammatory and retractile disorders, as well as easier hygiene.  Risks include bleeding , infection, injury of glans which may lead to penile deformity or urinary tract issues, unsatisfactory cosmetic appearance and other potential complications related to the procedure.  It was emphasized that this is an elective procedure.  Written informed consent obtained. After consent, pt later voiced that she would prefer outpatient circ after baby is discharged from NICU  3. SIPE - by PC 0.33 - no symptoms, no e/o SF so no mag - continue labetalol 200mg  BID (was on prior to pregnancy) - lasix 20mg  daily x 5d PP  Lennart Pall,  MD 06/15/2023 2:10 PM

## 2023-06-16 ENCOUNTER — Other Ambulatory Visit (HOSPITAL_COMMUNITY): Payer: Self-pay

## 2023-06-16 DIAGNOSIS — O119 Pre-existing hypertension with pre-eclampsia, unspecified trimester: Secondary | ICD-10-CM | POA: Diagnosis present

## 2023-06-16 DIAGNOSIS — Z8759 Personal history of other complications of pregnancy, childbirth and the puerperium: Secondary | ICD-10-CM

## 2023-06-16 DIAGNOSIS — O4590 Premature separation of placenta, unspecified, unspecified trimester: Secondary | ICD-10-CM | POA: Diagnosis present

## 2023-06-16 HISTORY — DX: Pre-existing hypertension with pre-eclampsia, unspecified trimester: O11.9

## 2023-06-16 LAB — CBC
HCT: 32.1 % — ABNORMAL LOW (ref 36.0–46.0)
Hemoglobin: 10.5 g/dL — ABNORMAL LOW (ref 12.0–15.0)
MCH: 28.6 pg (ref 26.0–34.0)
MCHC: 32.7 g/dL (ref 30.0–36.0)
MCV: 87.5 fL (ref 80.0–100.0)
Platelets: 247 10*3/uL (ref 150–400)
RBC: 3.67 MIL/uL — ABNORMAL LOW (ref 3.87–5.11)
RDW: 22 % — ABNORMAL HIGH (ref 11.5–15.5)
WBC: 10 10*3/uL (ref 4.0–10.5)
nRBC: 0 % (ref 0.0–0.2)

## 2023-06-16 MED ORDER — FUROSEMIDE 20 MG PO TABS
20.0000 mg | ORAL_TABLET | Freq: Every day | ORAL | 0 refills | Status: DC
  Filled 2023-06-16: qty 5, 5d supply, fill #0

## 2023-06-16 MED ORDER — LABETALOL HCL 200 MG PO TABS
200.0000 mg | ORAL_TABLET | Freq: Two times a day (BID) | ORAL | 2 refills | Status: DC
  Filled 2023-06-16 (×2): qty 60, 30d supply, fill #0

## 2023-06-16 MED ORDER — POTASSIUM CHLORIDE CRYS ER 20 MEQ PO TBCR
20.0000 meq | EXTENDED_RELEASE_TABLET | Freq: Every day | ORAL | 2 refills | Status: DC
  Filled 2023-06-16: qty 5, 5d supply, fill #0

## 2023-06-16 MED ORDER — ACETAMINOPHEN 500 MG PO TABS: 1000.0000 mg | ORAL_TABLET | Freq: Four times a day (QID) | ORAL | 2 refills | Status: DC | PRN

## 2023-06-16 MED ORDER — SENNOSIDES-DOCUSATE SODIUM 8.6-50 MG PO TABS
2.0000 | ORAL_TABLET | Freq: Every evening | ORAL | 2 refills | Status: DC | PRN
  Filled 2023-06-16: qty 30, 15d supply, fill #0

## 2023-06-16 MED ORDER — IBUPROFEN 800 MG PO TABS
800.0000 mg | ORAL_TABLET | Freq: Three times a day (TID) | ORAL | 2 refills | Status: DC | PRN
  Filled 2023-06-16: qty 60, 20d supply, fill #0

## 2023-06-16 NOTE — Discharge Summary (Signed)
 Postpartum Discharge Summary      Patient Name: Janet Villanueva DOB: 02/05/1996 MRN: [id number]  Date of admission: 06/13/2023 Delivery date:06/14/2023 Delivering provider: [place] Date of discharge: 06/16/2023  Admitting diagnosis: IUGR (intrauterine growth restriction) affecting care of mother [O36.5990] Intrauterine pregnancy: [redacted]w[redacted]d     Secondary diagnosis:  Principal Problem:   Chronic hypertension with superimposed preeclampsia Active Problems:   Essential hypertension in pregnancy   IUGR (intrauterine growth restriction) affecting care of mother, third trimester, not applicable or unspecified fetus   Maternal iron deficiency anemia affecting pregnancy in third trimester, antepartum   Group B Streptococcus carrier, +RV culture, currently pregnant   Status post vacuum-assisted vaginal delivery   Placental abruption, delivered  Additional problems: None    Discharge diagnosis: Term Pregnancy Delivered and CHTN with superimposed preeclampsia                                              Post partum procedures: None Augmentation: AROM, Pitocin, and Cytotec Complications: Placental Abruption  Hospital course: Induction of Labor With Vaginal Delivery   28 y.o. yo G2P1011 at [redacted]w[redacted]d was admitted to the hospital 06/13/2023 for induction of labor.  Indication for induction:  IUGR and CHTN .  Patient had an labor course complicated by development of superimposed preeclampsia, also noted to have placental abruption at delivey Membrane Rupture Time/Date: 1:02 AM,06/14/2023  Delivery Method:Vaginal, Vacuum (Extractor) Operative Delivery:Device used:Kiwi Indication: Fetal indications Episiotomy: None Lacerations:  1st degree;Cervical Details of delivery can be found in separate delivery note.  BP control was obtained with Labetalol 200 mg po bid and she was started on Lasix 20 mg po qd x 5 days, there were no further immediate complications.  Otherwise, patient had a routine  postpartum course. She is ambulating, tolerating a regular diet, passing flatus, and urinating well. Patient is discharged home in stable condition on 06/16/2023, and will follow up in the office for BP check in one week.  Newborn Data: Birth date:06/14/2023 Birth time:7:36 AM Gender:Female Living status:Living Apgars:7 ,8  Weight:2620 g  Magnesium Sulfate received: No BMZ received: No Rhophylac:N/A  Immunizations administered: Immunization History  Administered Date(s) Administered   DTaP 01/08/1995, 03/11/1995, 05/10/1995   HIB (PRP-OMP) 01/08/1995, 03/11/1995, 05/10/1995   HPV Quadrivalent 10/11/2012, 12/30/2012   Hepatitis B May 03, 1994, 01/08/1995, 08/17/1995   IPV 01/08/1995, 03/11/1995, 05/10/1995   Influenza, Seasonal, Injecte, Preservative Fre 04/24/2023   Influenza,inj,Quad PF,6+ Mos 02/28/2013, 02/24/2018   MMR 12/01/1995, 08/28/2016   Moderna Covid-19 Vaccine Bivalent Booster 44yrs & up 08/08/2021   Moderna Sars-Covid-2 Vaccination 06/26/2019   Tdap 10/11/2012, 04/27/2023   Varicella 12/01/1995    Physical exam  Vitals:   06/15/23 1248 06/15/23 1652 06/15/23 1928 06/16/23 0336  BP: 125/85 125/72 132/81 118/66  Pulse: (!) 101 86 82 90  Resp: 18 16 16 17   Temp: 98.4 F (36.9 C) 97.8 F (36.6 C) 98.2 F (36.8 C) 97.9 F (36.6 C)  TempSrc: Oral Oral Oral Oral  SpO2: 99% 99% 100% 99%  Weight:      Height:       General: alert, cooperative, and no distress Lochia: appropriate Uterine Fundus: firm Incision: N/A DVT Evaluation: No evidence of DVT seen on physical exam. Negative Homan's sign. No cords or calf tenderness. No significant calf/ankle edema. Labs: Lab Results  Component Value Date   WBC 10.0 06/16/2023   HGB 10.5 (  L) 06/16/2023   HCT 32.1 (L) 06/16/2023   MCV 87.5 06/16/2023   PLT 247 06/16/2023      Latest Ref Rng & Units 06/13/2023    1:54 PM  CMP  Glucose 70 - 99 mg/dL 77   BUN 6 - 20 mg/dL 5   Creatinine 1.61 - 0.96 mg/dL 0.45    Sodium 409 - 811 mmol/L 137   Potassium 3.5 - 5.1 mmol/L 4.2   Chloride 98 - 111 mmol/L 106   CO2 22 - 32 mmol/L 23   Calcium 8.9 - 10.3 mg/dL 91.4   Total Protein 6.5 - 8.1 g/dL 6.5   Total Bilirubin 0.0 - 1.2 mg/dL 0.9   Alkaline Phos 38 - 126 U/L 112   AST 15 - 41 U/L 19   ALT 0 - 44 U/L 19    Edinburgh Score:     No data to display            After visit meds:  Allergies as of 06/16/2023   No Known Allergies      Medication List     STOP taking these medications    aspirin 81 MG chewable tablet       TAKE these medications    acetaminophen 500 MG tablet Commonly known as: TYLENOL Take 2 tablets (1,000 mg total) by mouth every 6 (six) hours as needed for mild pain (pain score 1-3).   famotidine 20 MG tablet Commonly known as: PEPCID Take 1 tablet (20 mg total) by mouth 2 (two) times daily.   furosemide 20 MG tablet Commonly known as: LASIX Take 1 tablet (20 mg total) by mouth daily for 5 days.   ibuprofen 800 MG tablet Commonly known as: ADVIL Take 1 tablet (800 mg total) by mouth 3 (three) times daily with meals as needed for headache, moderate pain (pain score 4-6) or cramping.   labetalol 200 MG tablet Commonly known as: NORMODYNE Take 1 tablet (200 mg total) by mouth 2 (two) times daily.   potassium chloride SA 20 MEQ tablet Commonly known as: KLOR-CON M Take 1 tablet (20 mEq total) by mouth daily for 5 days.   prenatal multivitamin Tabs tablet Take 1 tablet by mouth daily at 12 noon.   senna-docusate 8.6-50 MG tablet Commonly known as: Senokot-S Take 2 tablets by mouth at bedtime as needed for mild constipation or moderate constipation.        Discharge home in stable condition Infant Feeding: Breast Infant Disposition:NICU Discharge instruction: per After Visit Summary and Postpartum booklet. Activity: Advance as tolerated. Pelvic rest for 6 weeks.  Diet: routine diet Anticipated Birth Control: Unsure Future  Appointments: Future Appointments  Date Time Provider Department Center  06/22/2023  2:50 PM [facility] NURSE [facility]  07/20/2023  3:10 PM Shanice Poznanski, Jethro Bastos, MD [facility]       06/16/2023 Jaynie Collins, MD

## 2023-06-16 NOTE — Lactation Note (Signed)
 This note was copied from a baby's chart.  NICU Lactation Consultation Note  Patient Name: Boy Lachelle Rissler NWGNF'A Date: 06/16/2023 Age:29 hours  Reason for consult: Follow-up assessment; NICU baby; 1st time breastfeeding; Primapara; Infant < 6lbs; Early term 37-38.6wks; Other (Comment); Maternal discharge (cHTN, IUGR)  SUBJECTIVE Visited with family of 51 68/32 weeks old NICU female; Ms. Vegh reported she's been pumping for baby "Chosen" but still not getting any colostrum; she showed LC the entries filled out in her pumping log. Re-educated that the importance of pumping this early on is mainly for breast stimulation and not to get volume and praised her for all her efforts. She's getting discharged today. Reviewed discharge education, pump settings and the importance of consistent pumping for the onset of lactogenesis II and the prevention of engorgement.   OBJECTIVE Infant data: Mother's Current Feeding Choice: Breast Milk and Donor Milk  O2 Device: (S) HHFNC O2 Flow Rate (L/min): 4 L/min FiO2 (%): 25 %  Infant feeding assessment IDFTS - Readiness: 5   Maternal data: G2P1011 Vaginal, Vacuum (Extractor) Has patient been taught Hand Expression?: Yes Significant Breast History:: (+) breast changes during the pregnancy Current breast feeding challenges:: NICU admission Does the patient have breastfeeding experience prior to this delivery?: No Pumping frequency: 4-5 times/24 hours Pumped volume: 0 mL Flange Size: 21; 24 (#21 onb R and # 24 on L side (had nipple piecing scar on L side)) Hands-free pumping top sizes: Small/Medium (Blue) Risk factor for low/delayed milk supply:: primipara, prematurity, PPH of 580 cc, IUGR, < 6 lbs  Pump: Personal, Hands Free (MomCozy wearable pump (through insurance))  ASSESSMENT Infant: Feeding Status: Scheduled 8-11-2-5 Feeding method: Tube/Gavage (Bolus)  Maternal: Milk volume: Normal  INTERVENTIONS/PLAN Interventions: Interventions:  Breast feeding basics reviewed; DEBP; Education; Coconut oil Discharge Education: Engorgement and breast care Tools: Pump; Flanges; Coconut oil; Hands-free pumping top Pump Education: Setup, frequency, and cleaning; Milk Storage  Plan: STS whenever possible Massage and hand express both breasts prior and after pumping; coconut oil prior pumping for lubrication Pump both breasts on initiation mode every 3 hours for 15 minutes; ideally 8 pumping sessions/24 hours Take all pump pieces to baby's room after her discharge Switch pump setting to maintain mode once expression +20 ml of EBM combined or by day 5 Call for latch assistance when baby "Chosen" is ready to go to breast   No other support person at this time. All questions and concerns answered, family to contact [facility] services PRN.  Consult Status: NICU follow-up NICU Follow-up type: Verify absence of engorgement; Verify onset of copious milk   Sharvi Mooneyhan S Alverda Nazzaro 06/16/2023, 11:07 AM

## 2023-06-18 ENCOUNTER — Ambulatory Visit (HOSPITAL_COMMUNITY): Payer: Self-pay

## 2023-06-18 NOTE — Lactation Note (Signed)
 This note was copied from a baby's chart.  NICU Lactation Consultation Note  Patient Name: Janet Villanueva NWGNF'A Date: 06/18/2023 Age:28 years  Reason for consult: Follow-up assessment; Primapara; 1st time breastfeeding; NICU baby; Early term 37-38.6wks; Infant < 6lbs; Breastfeeding assistance; Mother's request  SUBJECTIVE  Mom called lactation office and requested assistance with breastfeeding.  Mom was told she could breastfeed after pumping.  Baby "Chosen" is still on HFNC of 2 L/min and receiving all gavage feedings.  Baby has been showing feeding readiness and sucking on a pacifier.  LC asked Mom to pre-pump.  Mom expressed 25 ml in 10 mins.  Mom to use the maintain mode from here out and pump for longer 20-30 mins.  Baby placed STS in football hold at left breast.  Baby actively cueing.  Baby trying to latch and Mom offered assistance in supporting her breast and supporting baby's head.  Baby unable to latch onto areola as Mom's nipple diameter is on the large size.  LC initiated a 20 mm nipple shield, showing Mom how to properly apply.  Nipple pulled half way into shield.  Baby able to attain a deep latch onto the breast and suck mostly non-nutritive sucking for 10 mins.  Mom was thrilled as LC offered a lot of teaching and reassurance.  Baby very contented when at the breast.  Mom assisted to pull baby up to her chest with her head reclined for STS during gavage feeding.  LC assisted Mom to pump while holding baby.  Larger bottles provided as LC expects milk volume to increase overnight.  OBJECTIVE Infant data: No data recorded O2 Device: HHFNC O2 Flow Rate (L/min): 2 L/min FiO2 (%): 21 %  Infant feeding assessment IDFTS - Readiness: 2   Maternal data: G2P1011 Vaginal, Vacuum (Extractor) Pumping frequency: 6-8 times per 24 hrs Pumped volume: 25 mL Flange Size: 21; 24  Pump: Personal, Hands Free (MomCozy wearable pump (through  insurance))  ASSESSMENT Infant: Latch: Repeated attempts needed to sustain latch, nipple held in mouth throughout feeding, stimulation needed to elicit sucking reflex. Audible Swallowing: None Type of Nipple: Everted at rest and after stimulation Comfort (Breast/Nipple): Soft / non-tender Hold (Positioning): Assistance needed to correctly position infant at breast and maintain latch. LATCH Score: 6  Feeding Status: Scheduled 8-11-2-5 Feeding method: Breast; Tube/Gavage (Bolus)  Maternal: Milk volume: Normal  INTERVENTIONS/PLAN Interventions: Interventions: Breast feeding basics reviewed; Assisted with latch; Skin to skin; Breast massage; Hand express; Pre-pump if needed; Breast compression; Adjust position; Support pillows; Position options; DEBP; Education Tools: Pump; Flanges; Nipple Dorris Carnes; Hands-free pumping top Pump Education: Setup, frequency, and cleaning; Milk Storage Nipple shield size: 20  Plan: 1- STS with baby often 2- Pre-pump 10-20 mins 3- If baby is cueing actively, Mom to apply nipple shield and put baby to the breast. 4- If baby is not cueing, baby should be STS during gavage feedings 5- Pump both breasts on maintain mode for 20-30 mins every 3hrs. 6- lactation to assist at 11 am tomorrow 06/19/23  Consult Status: NICU follow-up NICU Follow-up type: Verify onset of copious milk; Verify absence of engorgement; Assist with IDF-1 (Mother to pre-pump before breastfeeding)   Janet Villanueva 06/18/2023, 5:50 PM

## 2023-06-19 ENCOUNTER — Ambulatory Visit (HOSPITAL_COMMUNITY): Payer: Self-pay

## 2023-06-19 NOTE — Lactation Note (Signed)
 This note was copied from a baby's chart.  NICU Lactation Consultation Note  Patient Name: Janet Villanueva [id number] Date: 06/19/2023 Age:29 days  Reason for consult: Follow-up assessment; Primapara; 1st time breastfeeding; NICU baby; Early term 37-38.6wks; Infant < 6lbs; Breastfeeding assistance  SUBJECTIVE  LC in to assist with 11 am feeding at the breast with baby "Chosen" who is on 2L/min HFNC at 21%.  Baby noted to be cueing actively before feeding.    Mom pre-pumped 45 ml for 10 mins.  LC assisted with baby in football hold on left breast.  Baby cueing and attempting to latch, but unable to.  Nipple shield applied (20 mm) which is a little small, but with stretching, able to properly apply and draw nipple into shield.  Baby opened widely and brought onto breast for a few sucks.  Baby would suck for 2-4 sucks and then pause and appear to catch his breath.  No stress cues noted, baby remained relaxed at the breast.  Mom taught to hold her breast at base of breast, firmly to decrease baby pushing against the nipple.  After 10 mins, baby became fatigued and pushed the nipple out of his mouth.  No milk noted in shield at this feeding.  LC left Mom and baby STS in recliner.   LC encouraged Mom to finish her pumping after gavage feeding is finished.  Larger bottles provided.  OBJECTIVE Infant data: No data recorded O2 Device: HHFNC O2 Flow Rate (L/min): 2 L/min FiO2 (%): 21 %  Infant feeding assessment IDFTS - Readiness: 3   Maternal data: G2P1011 Vaginal, Vacuum (Extractor) Pumping frequency: 8 times per 24 hrs Pumped volume: 60 mL Flange Size: 21  Pump: Personal, Hands Free (MomCozy wearable pump (through insurance))  ASSESSMENT Infant: Latch: Repeated attempts needed to sustain latch, nipple held in mouth throughout feeding, stimulation needed to elicit sucking reflex. Audible Swallowing: None Type of Nipple: Everted at rest and after stimulation Comfort (Breast/Nipple):  Soft / non-tender Hold (Positioning): Assistance needed to correctly position infant at breast and maintain latch. LATCH Score: 6  Feeding Status: Scheduled 8-11-2-5 Feeding method: Breast  Maternal: Milk volume: Normal  INTERVENTIONS/PLAN Interventions: Interventions: Breast feeding basics reviewed; Assisted with latch; Skin to skin; Breast massage; Hand express; Pre-pump if needed; Breast compression; Adjust position; Support pillows; DEBP; Education Tools: Pump; Flanges; Hands-free pumping top; Nipple Shields Pump Education: Setup, frequency, and cleaning; Milk Storage Nipple shield size: 20  Plan: Consult Status: NICU follow-up NICU Follow-up type: Weekly NICU follow up   Judee Clara 06/19/2023, 11:32 AM

## 2023-06-20 ENCOUNTER — Ambulatory Visit (HOSPITAL_COMMUNITY): Payer: Self-pay

## 2023-06-20 NOTE — Lactation Note (Addendum)
 This note was copied from a baby's chart.  NICU Lactation Consultation Note  Patient Name: Janet Villanueva [id number] Date: 06/20/2023 Age:29 days  Reason for consult: Follow-up assessment; Primapara; 1st time breastfeeding; Breastfeeding assistance; RN request; Early term 37-38.6wks; Mother's request; Infant < 6lbs; NICU baby; Other (Comment) (cHTN)  SUBJECTIVE Visited with family of 76 57/65 weeks old NICU female for the 11 am feeding assist, NICU RN Enrique Sack asked this LC to observe this feeding since it was the first time Ms. Pringle attempted to put baby "Chosen" at an unpumped breast. He's still with 2 L/min of O2 support via nasal canula which made achieving a deeper latch a bit challenging. He kept slipping off the breast without a NS $ 20, he won't latch without it, he was uncoordinated searching for the nipple even though it was in front of his mouth; he only did a few sucks. Once NS # 20 was placed on the L side in football hold, he was able to latch but needed some suck training to get into a sucking pattern, then he was gently transitioned to the breast, had to do this 3 times during this 10 minutes feeding; he took several breaks just "sitting at the breast" NNS was the only pattern observed. Noted barely any traces of EBM on NS # 20. Ms. Demo is pumping consistently day and night and her supply continues to increase, praised her for her efforts. She voiced that her only concern it to put the NS # 20 on, practice with her until she got it on, on her own. She told this LC that she also wants to do bottles; her plan is to do both, direct breastfeeding along with pumping and bottle feeding.  OBJECTIVE Infant data: Mother's Current Feeding Choice: Breast Milk and Donor Milk  O2 Device: HHFNC O2 Flow Rate (L/min): 2 L/min FiO2 (%): 21 %  Infant feeding assessment IDFTS - Readiness: 1   Maternal data: G2P1011 Vaginal, Vacuum (Extractor) Pumping frequency: 7 times/24 hours Pumped  volume: 80 mL Flange Size: 21  Pump: Personal, Hands Free (MomCozy wearable pump (through insurance))  ASSESSMENT Infant: Latch: Repeated attempts needed to sustain latch, nipple held in mouth throughout feeding, stimulation needed to elicit sucking reflex. Audible Swallowing: None Type of Nipple: Everted at rest and after stimulation Comfort (Breast/Nipple): Soft / non-tender Hold (Positioning): Assistance needed to correctly position infant at breast and maintain latch. LATCH Score: 6  Feeding Status: Scheduled 8-11-2-5 Feeding method: Breast  Maternal: Milk volume: Normal  INTERVENTIONS/PLAN Interventions: Interventions: Breast feeding basics reviewed; Assisted with latch; Skin to skin; Breast massage; Hand express; Breast compression; Adjust position; Support pillows; DEBP; Education Tools: Nipple Shields Pump Education: Setup, frequency, and cleaning; Milk Storage Nipple shield size: 20  Plan: Pump both breasts on initiation mode every 3 hours for 15 minutes; ideally 8 pumping sessions/24 hours Take baby "Chosen" to breast on feeding cues around feeding times using NS # 20 PRN. If he doesn't latch, couplet will engage in STS care   No other support person at this time. All questions and concerns answered, family to contact [facility] services PRN.  Consult Status: NICU follow-up NICU Follow-up type: Weekly NICU follow up   Tuana Hoheisel S Berthe Oley 06/20/2023, 1:22 PM

## 2023-06-22 ENCOUNTER — Encounter: Payer: Managed Care, Other (non HMO) | Admitting: Obstetrics & Gynecology

## 2023-06-22 ENCOUNTER — Ambulatory Visit (INDEPENDENT_AMBULATORY_CARE_PROVIDER_SITE_OTHER): Payer: Managed Care, Other (non HMO)

## 2023-06-22 VITALS — BP 134/86

## 2023-06-22 DIAGNOSIS — Z0130 Encounter for examination of blood pressure without abnormal findings: Secondary | ICD-10-CM

## 2023-06-22 DIAGNOSIS — O10019 Pre-existing essential hypertension complicating pregnancy, unspecified trimester: Secondary | ICD-10-CM

## 2023-06-22 NOTE — Progress Notes (Signed)
 Subjective:  Janet Villanueva is a 28 y.o. female here for BP check.   Hypertension ROS: taking medications as instructed, no medication side effects noted, no TIA's, no chest pain on exertion, no dyspnea on exertion, and no swelling of ankles.     Objective:  LMP 09/24/2022   Appearance alert, well appearing, and in no distress.    Assessment:   Blood Pressure today in office asymptomatic and reasonably well controlled.    Plan:  Follow up at Postpartum visit  .

## 2023-06-24 ENCOUNTER — Ambulatory Visit (HOSPITAL_COMMUNITY): Payer: Self-pay

## 2023-06-24 NOTE — Lactation Note (Signed)
 This note was copied from a baby's chart. Lactation Consultation Note  Patient Name: Janet Villanueva [id number] Date: 06/24/2023 Age:29 days   Baby discharged prior to [facility] consult.  LC sent message to OP lactation at [facility].   Judee Clara 06/24/2023, 3:04 PM

## 2023-06-29 ENCOUNTER — Encounter: Payer: Managed Care, Other (non HMO) | Admitting: Obstetrics and Gynecology

## 2023-07-01 ENCOUNTER — Inpatient Hospital Stay (HOSPITAL_COMMUNITY): Admit: 2023-07-01 | Payer: Managed Care, Other (non HMO)

## 2023-07-20 ENCOUNTER — Ambulatory Visit: Payer: Managed Care, Other (non HMO) | Admitting: Obstetrics & Gynecology

## 2023-07-20 ENCOUNTER — Encounter: Payer: Self-pay | Admitting: Obstetrics & Gynecology

## 2023-07-20 NOTE — Progress Notes (Signed)
 Post Partum Visit Note  Janet Villanueva is a 28 y.o. G67P1011 female who presents for a postpartum visit. She is 5 weeks postpartum following a vacuum-assisted vaginal delivery.  I have fully reviewed the prenatal and intrapartum course, complicated by [facility] with superimposed preeclampsia, IUGR. The delivery was at 37.4 gestational weeks.  Anesthesia: epidural. Postpartum course has been Well. Baby is doing well. Baby is feeding by both breast and bottle - Similac Neosure. Bleeding staining only. Bowel function is normal. Bladder function is normal. Patient is not sexually active. Contraception method is abstinence. Postpartum depression screening: positive.  The pregnancy intention screening data noted above was reviewed. Potential methods of contraception were discussed. The patient elected to proceed with No data recorded.   Edinburgh Postnatal Depression Scale - 07/20/23 1511       Edinburgh Postnatal Depression Scale:  In the Past 7 Days   I have been able to laugh and see the funny side of things. 0    I have looked forward with enjoyment to things. 0    I have blamed myself unnecessarily when things went wrong. 0    I have been anxious or worried for no good reason. 0    I have felt scared or panicky for no good reason. 0    Things have been getting on top of me. 0    I have been so unhappy that I have had difficulty sleeping. 0    I have felt sad or miserable. 0    I have been so unhappy that I have been crying. 0    The thought of harming myself has occurred to me. 0    Edinburgh Postnatal Depression Scale Total 0             Health Maintenance Due  Topic Date Due   HPV VACCINES (3 - 3-dose series) 04/12/2013   COVID-19 Vaccine (3 - 2024-25 season) 12/20/2022    The following portions of the patient's history were reviewed and updated as appropriate: allergies, current medications, past family history, past medical history, past social history, past surgical  history, and problem list.  Review of Systems Pertinent items noted in HPI and remainder of comprehensive ROS otherwise negative.  Objective: (done in presence of RN as chaperone)  BP (!) 134/91   Pulse 80   Wt 163 lb (73.9 kg)   LMP 09/24/2022   Breastfeeding Yes   BMI 27.12 kg/m    General:  alert and no distress   Breasts:  normal  Lungs: clear to auscultation bilaterally  Heart:  regular rate and rhythm  Abdomen: soft, non-tender; bowel sounds normal; no masses,  no organomegaly   GU exam:  not indicated       Assessment:   Normal postpartum exam.   Plan:   Essential components of care per ACOG recommendations:  1.  Mood and well being: Patient with negative depression screening today. Reviewed local resources for support.  - Patient tobacco use? No.   - hx of drug use? No.    2. Infant care and feeding:  -Patient currently breastmilk feeding? Yes. Reviewed importance of draining breast regularly to support lactation.  -Social determinants of health (SDOH) reviewed in EPIC. No concerns  3. Sexuality, contraception and birth spacing - Patient does not want a pregnancy in the next year.  Desired family size is 2 children.  - Reviewed reproductive life planning. Reviewed contraceptive methods based on pt preferences and effectiveness.  Patient desired FAM today.   -  Discussed birth spacing of 18 months  4. Sleep and fatigue -Encouraged family/partner/community support of 4 hrs of uninterrupted sleep to help with mood and fatigue  5. Physical Recovery  - Discussed patients delivery and complications. She describes her labor as good. - Patient had a Vaginal, no problems at delivery. Patient had a 1st degree  and mild cervical laceration. Perineal healing reviewed. Patient expressed understanding - Patient has urinary incontinence? No. - Patient is safe to resume physical and sexual activity  6.  Health Maintenance - HM due items addressed Yes - Last pap smear   Diagnosis  Date Value Ref Range Status  12/23/2022 (A)  Final   - Atypical squamous cells of undetermined significance (ASC-US)   Pap smear not done at today's visit.  -Breast Cancer screening indicated? No.   7. Chronic Disease/Pregnancy Condition follow up: Hypertension - Continue prescribed Labetolol - PCP follow up   Jaynie Collins, MD [facility], [facility]

## 2023-08-13 ENCOUNTER — Other Ambulatory Visit: Payer: Self-pay | Admitting: Family Medicine

## 2023-09-26 NOTE — Patient Instructions (Incomplete)
 Be Involved in Caring For Your Health:  Taking Medications When medications are taken as directed, they can greatly improve your health. But if they are not taken as prescribed, they may not work. In some cases, not taking them correctly can be harmful. To help ensure your treatment remains effective and safe, understand your medications and how to take them. Bring your medications to each visit for review by your provider.  Your lab results, notes, and after visit summary will be available on My Chart. We strongly encourage you to use this feature. If lab results are abnormal the clinic will contact you with the appropriate steps. If the clinic does not contact you assume the results are satisfactory. You can always view your results on My Chart. If you have questions regarding your health or results, please contact the clinic during office hours. You can also ask questions on My Chart.  We at [facility] are grateful that you chose [place] to provide your care. We strive to provide evidence-based and compassionate care and are always looking for feedback. If you get a survey from the clinic please complete this so we can hear your opinions.  DASH Eating Plan DASH stands for Dietary Approaches to Stop Hypertension. The DASH eating plan is a healthy eating plan that has been shown to: Lower high blood pressure (hypertension). Reduce your risk for type 2 diabetes, heart disease, and stroke. Help with weight loss. What are tips for following this plan? Reading food labels Check food labels for the amount of salt (sodium) per serving. Choose foods with less than 5 percent of the Daily Value (DV) of sodium. In general, foods with less than 300 milligrams (mg) of sodium per serving fit into this eating plan. To find whole grains, look for the word "whole" as the first word in the ingredient list. Shopping Buy products labeled as "low-sodium" or "no salt added." Buy fresh foods. Avoid canned  foods and pre-made or frozen meals. Cooking Try not to add salt when you cook. Use salt-free seasonings or herbs instead of table salt or sea salt. Check with your health care provider or pharmacist before using salt substitutes. Do not fry foods. Cook foods in healthy ways, such as baking, boiling, grilling, roasting, or broiling. Cook using oils that are good for your heart. These include olive, canola, avocado, soybean, and sunflower oil. Meal planning  Eat a balanced diet. This should include: 4 or more servings of fruits and 4 or more servings of vegetables each day. Try to fill half of your plate with fruits and vegetables. 6-8 servings of whole grains each day. 6 or less servings of lean meat, poultry, or fish each day. 1 oz is 1 serving. A 3 oz (85 g) serving of meat is about the same size as the palm of your hand. One egg is 1 oz (28 g). 2-3 servings of low-fat dairy each day. One serving is 1 cup (237 mL). 1 serving of nuts, seeds, or beans 5 times each week. 2-3 servings of heart-healthy fats. Healthy fats called omega-3 fatty acids are found in foods such as walnuts, flaxseeds, fortified milks, and eggs. These fats are also found in cold-water fish, such as sardines, salmon, and mackerel. Limit how much you eat of: Canned or prepackaged foods. Food that is high in trans fat, such as fried foods. Food that is high in saturated fat, such as fatty meat. Desserts and other sweets, sugary drinks, and other foods with added sugar. Full-fat  dairy products. Do not salt foods before eating. Do not eat more than 4 egg yolks a week. Try to eat at least 2 vegetarian meals a week. Eat more home-cooked food and less restaurant, buffet, and fast food. Lifestyle When eating at a restaurant, ask if your food can be made with less salt or no salt. If you drink alcohol: Limit how much you have to: 0-1 drink a day if you are female. 0-2 drinks a day if you are female. Know how much alcohol is in  your drink. In the [place] one drink is one 12 oz bottle of beer (355 mL), one 5 oz glass of wine (148 mL), or one 1 oz glass of hard liquor (44 mL). General information Avoid eating more than 2,300 mg of salt a day. If you have hypertension, you may need to reduce your sodium intake to 1,500 mg a day. Work with your provider to stay at a healthy body weight or lose weight. Ask what the best weight range is for you. On most days of the week, get at least 30 minutes of exercise that causes your heart to beat faster. This may include walking, swimming, or biking. Work with your provider or dietitian to adjust your eating plan to meet your specific calorie needs. What foods should I eat? Fruits All fresh, dried, or frozen fruit. Canned fruits that are in their natural juice and do not have sugar added to them. Vegetables Fresh or frozen vegetables that are raw, steamed, roasted, or grilled. Low-sodium or reduced-sodium tomato and vegetable juice. Low-sodium or reduced-sodium tomato sauce and tomato paste. Low-sodium or reduced-sodium canned vegetables. Grains Whole-grain or whole-wheat bread. Whole-grain or whole-wheat pasta. Brown rice. Orpah Cobb. Bulgur. Whole-grain and low-sodium cereals. Pita bread. Low-fat, low-sodium crackers. Whole-wheat flour tortillas. Meats and other proteins Skinless chicken or [place]. Ground chicken or [place]. Pork with fat trimmed off. Fish and seafood. Egg whites. Dried beans, peas, or lentils. Unsalted nuts, nut butters, and seeds. Unsalted canned beans. Lean cuts of beef with fat trimmed off. Low-sodium, lean precooked or cured meat, such as sausages or meat loaves. Dairy Low-fat (1%) or fat-free (skim) milk. Reduced-fat, low-fat, or fat-free cheeses. Nonfat, low-sodium ricotta or cottage cheese. Low-fat or nonfat yogurt. Low-fat, low-sodium cheese. Fats and oils Soft margarine without trans fats. Vegetable oil. Reduced-fat, low-fat, or light mayonnaise and salad  dressings (reduced-sodium). Canola, safflower, olive, avocado, soybean, and sunflower oils. Avocado. Seasonings and condiments Herbs. Spices. Seasoning mixes without salt. Other foods Unsalted popcorn and pretzels. Fat-free sweets. The items listed above may not be all the foods and drinks you can have. Talk to a dietitian to learn more. What foods should I avoid? Fruits Canned fruit in a light or heavy syrup. Fried fruit. Fruit in cream or butter sauce. Vegetables Creamed or fried vegetables. Vegetables in a cheese sauce. Regular canned vegetables that are not marked as low-sodium or reduced-sodium. Regular canned tomato sauce and paste that are not marked as low-sodium or reduced-sodium. Regular tomato and vegetable juices that are not marked as low-sodium or reduced-sodium. Rosita Fire. Olives. Grains Baked goods made with fat, such as croissants, muffins, or some breads. Dry pasta or rice meal packs. Meats and other proteins Fatty cuts of meat. Ribs. Fried meat. Tomasa Blase. Bologna, salami, and other precooked or cured meats, such as sausages or meat loaves, that are not lean and low in sodium. Fat from the back of a pig (fatback). Bratwurst. Salted nuts and seeds. Canned beans with added salt. Canned  or smoked fish. Whole eggs or egg yolks. Chicken or [place] with skin. Dairy Whole or 2% milk, cream, and half-and-half. Whole or full-fat cream cheese. Whole-fat or sweetened yogurt. Full-fat cheese. Nondairy creamers. Whipped toppings. Processed cheese and cheese spreads. Fats and oils Butter. Stick margarine. Lard. Shortening. Ghee. Bacon fat. Tropical oils, such as coconut, palm kernel, or palm oil. Seasonings and condiments Onion salt, garlic salt, seasoned salt, table salt, and sea salt. Worcestershire sauce. Tartar sauce. Barbecue sauce. Teriyaki sauce. Soy sauce, including reduced-sodium soy sauce. Steak sauce. Canned and packaged gravies. Fish sauce. Oyster sauce. Cocktail sauce. Store-bought  horseradish. Ketchup. Mustard. Meat flavorings and tenderizers. Bouillon cubes. Hot sauces. Pre-made or packaged marinades. Pre-made or packaged taco seasonings. Relishes. Regular salad dressings. Other foods Salted popcorn and pretzels. The items listed above may not be all the foods and drinks you should avoid. Talk to a dietitian to learn more. Where to find more information National Heart, Lung, and Blood Institute (NHLBI): BuffaloDryCleaner.gl American Heart Association (AHA): heart.org Academy of Nutrition and Dietetics: eatright.org National Kidney Foundation (NKF): kidney.org This information is not intended to replace advice given to you by your health care provider. Make sure you discuss any questions you have with your health care provider. Document Revised: 04/23/2022 Document Reviewed: 04/23/2022 Elsevier Patient Education  2024 [employer].

## 2023-09-27 ENCOUNTER — Ambulatory Visit: Admitting: Nurse Practitioner

## 2023-09-27 DIAGNOSIS — I10 Essential (primary) hypertension: Secondary | ICD-10-CM

## 2023-09-27 DIAGNOSIS — Z13220 Encounter for screening for lipoid disorders: Secondary | ICD-10-CM

## 2023-09-27 DIAGNOSIS — D649 Anemia, unspecified: Secondary | ICD-10-CM

## 2023-10-29 ENCOUNTER — Ambulatory Visit
Admission: RE | Admit: 2023-10-29 | Discharge: 2023-10-29 | Disposition: A | Discharge status: Home / Self Care | Payer: Self-pay | Attending: Emergency Medicine | Admitting: Emergency Medicine

## 2023-10-29 ENCOUNTER — Emergency Department
Admission: EM | Admit: 2023-10-29 | Discharge: 2023-10-29 | Disposition: A | Discharge status: Home / Self Care | Source: Ambulatory Visit | Attending: Emergency Medicine | Admitting: Emergency Medicine

## 2023-10-29 ENCOUNTER — Other Ambulatory Visit: Payer: Self-pay

## 2023-10-29 ENCOUNTER — Emergency Department

## 2023-10-29 VITALS — BP 139/89 | HR 80 | Temp 97.8°F | Resp 18

## 2023-10-29 DIAGNOSIS — R42 Dizziness and giddiness: Secondary | ICD-10-CM | POA: Insufficient documentation

## 2023-10-29 DIAGNOSIS — R9431 Abnormal electrocardiogram [ECG] [EKG]: Secondary | ICD-10-CM

## 2023-10-29 DIAGNOSIS — I10 Essential (primary) hypertension: Secondary | ICD-10-CM | POA: Insufficient documentation

## 2023-10-29 LAB — COMPREHENSIVE METABOLIC PANEL WITH GFR
ALT: 18 U/L (ref 0–44)
AST: 20 U/L (ref 15–41)
Albumin: 4.1 g/dL (ref 3.5–5.0)
Alkaline Phosphatase: 88 U/L (ref 38–126)
Anion gap: 9 (ref 5–15)
BUN: 17 mg/dL (ref 6–20)
CO2: 25 mmol/L (ref 22–32)
Calcium: 10 mg/dL (ref 8.9–10.3)
Chloride: 102 mmol/L (ref 98–111)
Creatinine, Ser: 0.95 mg/dL (ref 0.44–1.00)
GFR, Estimated: >60 mL/min (ref >60)
Glucose, Bld: 84 mg/dL (ref 70–99)
Potassium: 3.8 mmol/L (ref 3.5–5.1)
Sodium: 136 mmol/L (ref 135–145)
Total Bilirubin: 1.1 mg/dL (ref 0.0–1.2)
Total Protein: 7.6 g/dL (ref 6.5–8.1)

## 2023-10-29 LAB — CBC
HCT: 44.5 % (ref 36.0–46.0)
Hemoglobin: 14.6 g/dL (ref 12.0–15.0)
MCH: 29.2 pg (ref 26.0–34.0)
MCHC: 32.8 g/dL (ref 30.0–36.0)
MCV: 89 fL (ref 80.0–100.0)
Platelets: 300 K/uL (ref 150–400)
RBC: 5 MIL/uL (ref 3.87–5.11)
RDW: 13.3 % (ref 11.5–15.5)
WBC: 5 K/uL (ref 4.0–10.5)
nRBC: 0 % (ref 0.0–0.2)

## 2023-10-29 LAB — URINALYSIS, ROUTINE W REFLEX MICROSCOPIC
Bacteria, UA: NONE SEEN
Bilirubin Urine: NEGATIVE
Glucose, UA: NEGATIVE mg/dL
Hgb urine dipstick: NEGATIVE
Ketones, ur: NEGATIVE mg/dL
Nitrite: NEGATIVE
Protein, ur: NEGATIVE mg/dL
Specific Gravity, Urine: 1.002 — ABNORMAL LOW (ref 1.005–1.030)
pH: 6 (ref 5.0–8.0)

## 2023-10-29 MED ORDER — MECLIZINE HCL 25 MG PO TABS
25.0000 mg | ORAL_TABLET | Freq: Once | ORAL | Status: AC
  Administered 2023-10-29: 25 mg via ORAL
  Filled 2023-10-29: qty 1

## 2023-10-29 MED ORDER — MECLIZINE HCL 25 MG PO TABS: 25.0000 mg | ORAL_TABLET | Freq: Three times a day (TID) | ORAL | 0 refills | Status: DC | PRN

## 2023-10-29 MED ORDER — IOHEXOL 350 MG/ML SOLN
80.0000 mL | Freq: Once | INTRAVENOUS | Status: AC | PRN
  Administered 2023-10-29: 80 mL via INTRAVENOUS

## 2023-10-29 NOTE — ED Triage Notes (Signed)
 Patient to Urgent Care with complaints of intermittent dizziness (worse with positional changes, describes it as the world spinning)/ nausea. Reports she had some thumping in her R ear prior to onset of symptoms.  Reports symptoms started 5 days ago. Denies any history of the same.

## 2023-10-29 NOTE — ED Triage Notes (Addendum)
 Pt sent by UC c/o dizziness x5 days and headache and emesis x2 days but resolved 3 days ago.  Dizziness is worse w/ positional changes.   Pt is currently breastfeeding.    Pt had an EKG at [facility] and provider noted changes since last EKG.

## 2023-10-29 NOTE — Discharge Instructions (Signed)
 CT imaging of your head is reassuring at this time with no concerning findings.  I have sent a medication to your pharmacy that helps with symptoms of positional vertigo to see if this resolves this.  Please follow-up with your regular doctor within the next week for reassessment.  Please return to the ED for any severe worsening symptoms such as one-sided numbness or weakness or speech changes.

## 2023-10-29 NOTE — ED Notes (Signed)
 Pt is CAOx4, breathing normally, and normal in color. Pt is complaining of being dizzy x1 week. Pt reports dizziness comes in episodes lasting about 20 minutes each. Pt reports one episode ov nausea/vomiting a few days ago, but nothing since. Pt is relaxing in bed and in NAD at this time. Pt ambulates without issue and has not had any falls related to the dizziness. No fall bracelet or bed alarm activated at this time due to same. Pt wearing shoes.

## 2023-10-29 NOTE — ED Provider Notes (Signed)
 [name]    CSN: [id number] Arrival date & time: 10/29/23  9062      History   Chief Complaint Chief Complaint  Patient presents with   Dizziness    Entered by patient    HPI Janet Villanueva is a 28 y.o. female.  Patient presents with intermittent dizziness for the last 5 days.  The episodes of dizziness usually occur with position change such as standing.  No alleviating factors identified.  Patient has had associated nausea and vomiting.  Last emesis occurred 4 days ago.  She also had a thumping noise in her right ear and a headache 4 days ago but none currently.  She denies numbness, weakness, chest pain, palpitations, shortness of breath, abdominal pain, fever, ear pain, sore throat, cough, diarrhea.  No OTC medications taken.  Her medical history includes hypertension and anemia.  She has not taken her blood pressure medication since February when she gave birth to her infant son; She reports this is due to forgetting to take the medication.  Last CBC and in her chart from 06/16/2023 shows hemoglobin 10.5, hematocrit 32.1.  She breast-feeds her infant.  The history is provided by the patient and medical records.    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   FOB screening offered    Chronic hypertension with superimposed preeclampsia 06/16/2023   Hypertension    IBS (irritable bowel syndrome) 05/20/2015   Seasonal allergic rhinitis 05/30/2021    Patient Active Problem List   Diagnosis Date Noted   Hypertension     Past Surgical History:  Procedure Laterality Date   LAPAROSCOPIC OVARIAN CYSTECTOMY Left 12/11/2022   Procedure: LAPAROSCOPIC OVARIAN CYSTECTOMY AND DETORSION;  Surgeon: Verdon Keen, MD;  Location: [facility] ORS;  Service: Gynecology;  Laterality: Left;   LAPAROSCOPY Left 12/11/2022   Procedure: LAPAROSCOPY DIAGNOSTIC;  Surgeon: Verdon Keen, MD;  Location: [facility] ORS;  Service:  Gynecology;  Laterality: Left;   TONSILLECTOMY     TONSILLECTOMY      OB History     Gravida  2   Para  1   Term  1   Preterm  0   AB  1   Living  1      SAB  1   IAB  0   Ectopic  0   Multiple  0   Live Births  1            Home Medications    Prior to Admission medications   Medication Sig Start Date End Date Taking? Authorizing Provider  acetaminophen  (TYLENOL ) 500 MG tablet Take 2 tablets (1,000 mg total) by mouth every 6 (six) hours as needed for mild pain (pain score 1-3). Patient not taking: Reported on 07/20/2023 06/16/23   Anyanwu, Ugonna A, MD  ibuprofen  (ADVIL ) 800 MG tablet Take 1 tablet (800 mg total) by mouth 3 (three) times daily with meals as needed for headache, moderate pain (pain score 4-6) or cramping. Patient not taking: Reported on 07/20/2023 06/16/23   Anyanwu, Ugonna A, MD  labetalol  (NORMODYNE ) 200 MG tablet TAKE 1 TABLET BY MOUTH TWICE A DAY 10/08/23   Fredirick Glenys RAMAN, MD  Prenatal Vit-Fe Fumarate-FA (PRENATAL MULTIVITAMIN) TABS tablet Take 1 tablet by mouth daily at 12 noon.    [provider]    Family History Family History  Problem Relation Age of Onset   Cancer Maternal Grandmother  breast   Diabetes Maternal Grandmother    Hypertension Maternal Grandfather    Thyroid disease Mother    Asthma Sister    Asthma Brother     Social History Social History   Tobacco Use   Smoking status: Never   Smokeless tobacco: Never  Vaping Use   Vaping status: Never Used  Substance Use Topics   Alcohol use: Not Currently    Comment: social   Drug use: No     Allergies   Patient has no known allergies.   Review of Systems Review of Systems  Constitutional:  Negative for chills and fever.  HENT:  Negative for ear pain and sore throat.   Respiratory:  Negative for cough and shortness of breath.   Cardiovascular:  Negative for palpitations.  Gastrointestinal:  Positive for nausea and vomiting. Negative for abdominal  pain and diarrhea.  Neurological:  Positive for dizziness and headaches. Negative for syncope, facial asymmetry, speech difficulty, weakness and numbness.     Physical Exam Triage Vital Signs ED Triage Vitals  Encounter Vitals Group     BP      Girls Systolic BP Percentile      Girls Diastolic BP Percentile      Boys Systolic BP Percentile      Boys Diastolic BP Percentile      Pulse      Resp      Temp      Temp src      SpO2      Weight      Height      Head Circumference      Peak Flow      Pain Score      Pain Loc      Pain Education      Exclude from Growth Chart    Orthostatic VS for the past 24 hrs:  BP- Lying Pulse- Lying BP- Sitting Pulse- Sitting BP- Standing at 0 minutes Pulse- Standing at 0 minutes  10/29/23 1028 149/85 73 (!) 132/93 79 128/90 77    Updated Vital Signs BP 139/89   Pulse 80   Temp 97.8 F (36.6 C)   Resp 18   SpO2 99%   Breastfeeding Yes   Visual Acuity Right Eye Distance:   Left Eye Distance:   Bilateral Distance:    Right Eye Near:   Left Eye Near:    Bilateral Near:     Physical Exam Constitutional:      General: She is not in acute distress. HENT:     Right Ear: Tympanic membrane normal.     Left Ear: Tympanic membrane normal.     Nose: Nose normal.     Mouth/Throat:     Mouth: Mucous membranes are moist.     Pharynx: Oropharynx is clear.  Cardiovascular:     Rate and Rhythm: Normal rate and regular rhythm.     Heart sounds: Normal heart sounds.  Pulmonary:     Effort: Pulmonary effort is normal. No respiratory distress.     Breath sounds: Normal breath sounds.  Neurological:     General: No focal deficit present.     Mental Status: She is alert and oriented to person, place, and time.     Sensory: No sensory deficit.     Motor: No weakness.     Gait: Gait normal.      UC Treatments / Results  Labs (all labs ordered are listed, but only abnormal results are displayed) Labs Reviewed -  No data to  display   EKG   Radiology No results found.  Procedures Procedures (including critical care time)  Medications Ordered in UC Medications - No data to display  Initial Impression / Assessment and Plan / UC Course  I have reviewed the triage vital signs and the nursing notes.  Pertinent labs & imaging results that were available during my care of the patient were reviewed by me and considered in my medical decision making (see chart for details).   Dizziness, lactating mother, abnormal EKG, orthostatic dizziness.  EKG shows sinus rhythm, rate 72, no ST elevation, compared to previous from 04/23/2023 and T wave change (inversion) in V3.  Sending patient to the ED for evaluation.  She is agreeable to this.  She declines EMS.  She feels stable to drive herself to [facility] ED.  Final Clinical Impressions(s) / UC Diagnoses   Final diagnoses:  Dizziness  Lactating mother  Abnormal EKG  Orthostatic dizziness     Discharge Instructions      Go to the emergency department for evaluation of your dizziness, abnormal EKG, orthostatic vital signs.     ED Prescriptions   None    PDMP not reviewed this encounter.   Corlis Burnard DEL, NP 10/29/23 1035

## 2023-10-29 NOTE — ED Provider Notes (Signed)
 [facility] Provider Note    Event Date/Time   First MD Initiated Contact with Patient 10/29/23 1154     (approximate)   History   Dizziness   HPI Janet Villanueva is a 28 y.o. female with history of HTN presenting today for dizziness.  Patient states over the last 5 days she has intermittent dizziness symptoms.  They seem to occur most when she goes from sitting to standing but sometimes can occur at rest.  It is described as room spinning sensation.  Denies any lightheadedness or passing out.  She also had headache for 4 days which is now resolved after use of ibuprofen  and did not describe it as severe.  Also had 2 days of nausea with vomiting that resolved 3 days ago.  She states she is approximately 5 months postpartum and not been taking her blood pressure medication as prescribed.  Denies any other acute symptoms such as chest pain, palpitations, shortness of breath, numbness, weakness anywhere.  No speech changes.     Physical Exam   Triage Vital Signs: ED Triage Vitals  Encounter Vitals Group     BP 10/29/23 1144 (!) 136/104     Girls Systolic BP Percentile --      Girls Diastolic BP Percentile --      Boys Systolic BP Percentile --      Boys Diastolic BP Percentile --      Pulse Rate 10/29/23 1144 86     Resp 10/29/23 1144 16     Temp 10/29/23 1144 97.8 F (36.6 C)     Temp Source 10/29/23 1144 Oral     SpO2 10/29/23 1144 100 %     Weight 10/29/23 1131 163 lb (73.9 kg)     Height 10/29/23 1131 5' 5 (1.651 m)     Head Circumference --      Peak Flow --      Pain Score 10/29/23 1131 0     Pain Loc --      Pain Education --      Exclude from Growth Chart --     Most recent vital signs: Vitals:   10/29/23 1144  BP: (!) 136/104  Pulse: 86  Resp: 16  Temp: 97.8 F (36.6 C)  SpO2: 100%   I have reviewed the vital signs. General:  Awake, alert, no acute distress. Head:  Normocephalic, Atraumatic. EENT:  PERRL, EOMI, Oral  mucosa pink and moist, Neck is supple. Cardiovascular: Regular rate, 2+ distal pulses. Respiratory:  Normal respiratory effort, symmetrical expansion, no distress.   Extremities:  Moving all four extremities through full ROM without pain.   Neuro:  Alert and oriented.  Interacting appropriately.  Cranial nerve II through XII intact.  Sensation and strength equal and normal throughout bilateral upper and lower extremities.  Dix-Hallpike test negative for any nystagmus to the left or the right.  Does experience room spinning sensation when standing up without nystagmus. Skin:  Warm, dry, no rash.   Psych: Appropriate affect.    ED Results / Procedures / Treatments   Labs (all labs ordered are listed, but only abnormal results are displayed) Labs Reviewed  URINALYSIS, ROUTINE W REFLEX MICROSCOPIC - Abnormal; Notable for the following components:      Result Value   Color, Urine COLORLESS (*)    APPearance CLEAR (*)    Specific Gravity, Urine 1.002 (*)    Leukocytes,Ua TRACE (*)    Non Squamous Epithelial PRESENT (*)  All other components within normal limits  COMPREHENSIVE METABOLIC PANEL WITH GFR  CBC     EKG My EKG interpretation: Rate of 86, normal sinus rhythm, normal axis, normal intervals.  No acute ST elevations or depressions   RADIOLOGY Independently interpreted CT venogram with no acute pathologies   PROCEDURES:  Critical Care performed: No  Procedures   MEDICATIONS ORDERED IN ED: Medications  meclizine  (ANTIVERT ) tablet 25 mg (has no administration in time range)  iohexol  (OMNIPAQUE ) 350 MG/ML injection 80 mL (80 mLs Intravenous Contrast Given 10/29/23 1329)     IMPRESSION / MDM / ASSESSMENT AND PLAN / ED COURSE  I reviewed the triage vital signs and the nursing notes.                              Differential diagnosis includes, but is not limited to, peripheral vertigo, BPPV, dural venous thrombosis, cavernous sinus thrombosis, orthostatic hypotension,  electrolyte abnormality  Patient's presentation is most consistent with acute complicated illness / injury requiring diagnostic workup.  Patient is a 28 year old female presenting today for 5 days of intermittent dizziness mostly occurring when she goes from sitting to standing.  Not described like a lightheadedness.  No other acute symptoms are present at this time and otherwise completely unremarkable neurological exam.  Vital signs are stable.  Her symptoms would most likely suggest BPPV or peripheral vertigo source, however her Dix-Hallpike was negative and occasionally has symptoms while at rest.  Given recent pregnancy and delivery, will get CT venogram to rule out any further acute intracranial abnormalities.  CT venogram shows no acute pathologies.  Patient given meclizine  and will discharge at this time with PCP follow-up.  No other acute abnormalities on exam at this time.  Stable for discharge.     FINAL CLINICAL IMPRESSION(S) / ED DIAGNOSES   Final diagnoses:  Dizziness     Rx / DC Orders   ED Discharge Orders          Ordered    meclizine  (ANTIVERT ) 25 MG tablet  3 times daily PRN        10/29/23 1458             Note:  This document was prepared using Dragon voice recognition software and may include unintentional dictation errors.   Malvina Alm DASEN, MD 10/29/23 1500

## 2023-10-29 NOTE — ED Notes (Signed)
 Pt updated on status of waiting on results. Pt denies any needs.

## 2023-10-29 NOTE — Discharge Instructions (Signed)
 Go to the emergency department for evaluation of your dizziness, abnormal EKG, orthostatic vital signs.

## 2023-11-25 ENCOUNTER — Encounter: Payer: Self-pay | Admitting: Obstetrics & Gynecology

## 2023-12-31 ENCOUNTER — Other Ambulatory Visit: Payer: Self-pay | Admitting: Obstetrics and Gynecology

## 2024-01-03 ENCOUNTER — Ambulatory Visit
Admission: RE | Admit: 2024-01-03 | Discharge: 2024-01-03 | Disposition: A | Discharge status: Home / Self Care | Attending: Emergency Medicine | Admitting: Emergency Medicine

## 2024-01-03 DIAGNOSIS — J029 Acute pharyngitis, unspecified: Secondary | ICD-10-CM

## 2024-01-03 DIAGNOSIS — B349 Viral infection, unspecified: Secondary | ICD-10-CM

## 2024-01-03 DIAGNOSIS — I10 Essential (primary) hypertension: Secondary | ICD-10-CM

## 2024-01-03 LAB — POC COVID19/FLU A&B COMBO
Covid Antigen, POC: NEGATIVE
Influenza A Antigen, POC: NEGATIVE
Influenza B Antigen, POC: NEGATIVE

## 2024-01-03 LAB — POCT RAPID STREP A (OFFICE): Rapid Strep A Screen: NEGATIVE

## 2024-01-03 NOTE — ED Provider Notes (Signed)
 Janet Villanueva    CSN: [id number] Arrival date & time: 01/03/24  1155      History   Chief Complaint Chief Complaint  Patient presents with   Sore Throat    Symptoms started Friday, congestion, high fever, sore throat, headaches. - Entered by patient    HPI Janet Villanueva is a 28 y.o. female.  Patient presents with 3-day history of fever, headache, sore throat, congestion, sneezing, cough.  Tmax 101.3.  No OTC medication taken today.  No shortness of breath, chest pain, vomiting, diarrhea.  Her medical history includes hypertension.  She is a breast-feeding mother.   The history is provided by the patient and medical records.    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   FOB screening offered    Chronic hypertension with superimposed preeclampsia 06/16/2023   Hypertension    IBS (irritable bowel syndrome) 05/20/2015   Seasonal allergic rhinitis 05/30/2021    Patient Active Problem List   Diagnosis Date Noted   Hypertension     Past Surgical History:  Procedure Laterality Date   LAPAROSCOPIC OVARIAN CYSTECTOMY Left 12/11/2022   Procedure: LAPAROSCOPIC OVARIAN CYSTECTOMY AND DETORSION;  Surgeon: Verdon Keen, MD;  Location: [facility] ORS;  Service: Gynecology;  Laterality: Left;   LAPAROSCOPY Left 12/11/2022   Procedure: LAPAROSCOPY DIAGNOSTIC;  Surgeon: Verdon Keen, MD;  Location: [facility] ORS;  Service: Gynecology;  Laterality: Left;   TONSILLECTOMY     TONSILLECTOMY      OB History     Gravida  2   Para  1   Term  1   Preterm  0   AB  1   Living  1      SAB  1   IAB  0   Ectopic  0   Multiple  0   Live Births  1            Home Medications    Prior to Admission medications   Medication Sig Start Date End Date Taking? Authorizing Provider  acetaminophen  (TYLENOL ) 500 MG tablet Take 2 tablets (1,000 mg total) by mouth every 6 (six) hours as needed for mild pain  (pain score 1-3). Patient not taking: Reported on 07/20/2023 06/16/23   Anyanwu, Ugonna A, MD  ibuprofen  (ADVIL ) 800 MG tablet Take 1 tablet (800 mg total) by mouth 3 (three) times daily with meals as needed for headache, moderate pain (pain score 4-6) or cramping. Patient not taking: Reported on 07/20/2023 06/16/23   Anyanwu, Ugonna A, MD  labetalol  (NORMODYNE ) 200 MG tablet TAKE 1 TABLET BY MOUTH TWICE A DAY 10/08/23   Fredirick Glenys RAMAN, MD  meclizine  (ANTIVERT ) 25 MG tablet Take 1 tablet (25 mg total) by mouth 3 (three) times daily as needed for dizziness. 10/29/23   Malvina Alm DASEN, MD  Prenatal Vit-Fe Fumarate-FA (PRENATAL MULTIVITAMIN) TABS tablet Take 1 tablet by mouth daily at 12 noon. Patient not taking: Reported on 01/03/2024    [provider]    Family History Family History  Problem Relation Age of Onset   Cancer Maternal Grandmother        breast   Diabetes Maternal Grandmother    Hypertension Maternal Grandfather    Thyroid disease Mother    Asthma Sister    Asthma Brother     Social History Social History   Tobacco Use   Smoking status: Never   Smokeless tobacco: Never  Vaping Use  Vaping status: Never Used  Substance Use Topics   Alcohol use: Not Currently    Comment: social   Drug use: No     Allergies   Patient has no known allergies.   Review of Systems Review of Systems  Constitutional:  Positive for fever. Negative for chills.  HENT:  Positive for congestion, rhinorrhea, sneezing and sore throat. Negative for ear pain.   Respiratory:  Positive for cough. Negative for shortness of breath.   Cardiovascular:  Negative for chest pain and palpitations.  Gastrointestinal:  Negative for diarrhea and vomiting.  Neurological:  Positive for headaches.     Physical Exam Triage Vital Signs ED Triage Vitals [01/03/24 1208]  Encounter Vitals Group     BP      Girls Systolic BP Percentile      Girls Diastolic BP Percentile      Boys Systolic BP  Percentile      Boys Diastolic BP Percentile      Pulse      Resp      Temp      Temp src      SpO2      Weight      Height      Head Circumference      Peak Flow      Pain Score 2     Pain Loc      Pain Education      Exclude from Growth Chart    No data found.  Updated Vital Signs BP (!) 129/96   Pulse 67   Temp 98.2 F (36.8 C)   Resp 20   SpO2 98%   Breastfeeding Yes   Visual Acuity Right Eye Distance:   Left Eye Distance:   Bilateral Distance:    Right Eye Near:   Left Eye Near:    Bilateral Near:     Physical Exam Constitutional:      General: She is not in acute distress. HENT:     Right Ear: Tympanic membrane normal.     Left Ear: Tympanic membrane normal.     Nose: Nose normal.     Mouth/Throat:     Mouth: Mucous membranes are moist.     Pharynx: Oropharynx is clear.  Cardiovascular:     Rate and Rhythm: Normal rate and regular rhythm.     Heart sounds: Normal heart sounds.  Pulmonary:     Effort: Pulmonary effort is normal. No respiratory distress.     Breath sounds: Normal breath sounds.  Neurological:     Mental Status: She is alert.      UC Treatments / Results  Labs (all labs ordered are listed, but only abnormal results are displayed) Labs Reviewed  POC COVID19/FLU A&B COMBO - Normal  POCT RAPID STREP A (OFFICE) - Normal    EKG   Radiology No results found.  Procedures Procedures (including critical care time)  Medications Ordered in UC Medications - No data to display  Initial Impression / Assessment and Plan / UC Course  I have reviewed the triage vital signs and the nursing notes.  Pertinent labs & imaging results that were available during my care of the patient were reviewed by me and considered in my medical decision making (see chart for details).    Lactating mother, elevated blood pressure reading with hypertension, sore throat, viral illness.  Rapid COVID, flu, strep negative.  Lungs are clear and O2 sat is  98% on room air.  Instructed patient to  continue symptomatic treatment including Tylenol  as needed.  Instructed her to follow-up with her PCP.  Education provided on viral illness.  Also discussed with patient that her blood pressure is elevated today and needs to be rechecked by PCP.  Education provided on managing hypertension.  She agrees to plan of care.  Final Clinical Impressions(s) / UC Diagnoses   Final diagnoses:  Lactating mother  Elevated blood pressure reading in office with diagnosis of hypertension  Sore throat  Viral illness     Discharge Instructions      Your COVID, flu, strep tests are negative.  Take Tylenol  as needed.  Follow-up with your primary care provider.  Your blood pressure is elevated today at 129/96.  Please have this rechecked by your primary care provider in 1-2 weeks.          ED Prescriptions   None    PDMP not reviewed this encounter.   Corlis Burnard DEL, NP 01/03/24 1242

## 2024-01-03 NOTE — ED Triage Notes (Signed)
 Patient to Urgent Care with complaints of sore throat/ nasal congestion/ fevers (101.3 max)/ headaches/ sneezing.   Symptoms started Friday night.   Meds: taking ibuprofen  (breastfeeding).

## 2024-01-03 NOTE — Discharge Instructions (Addendum)
 Your COVID, flu, strep tests are negative.  Take Tylenol  as needed.  Follow-up with your primary care provider.  Your blood pressure is elevated today at 129/96.  Please have this rechecked by your primary care provider in 1-2 weeks.

## 2024-02-03 ENCOUNTER — Telehealth: Admitting: Physician Assistant

## 2024-02-03 DIAGNOSIS — B9689 Other specified bacterial agents as the cause of diseases classified elsewhere: Secondary | ICD-10-CM

## 2024-02-03 DIAGNOSIS — N760 Acute vaginitis: Secondary | ICD-10-CM

## 2024-02-03 MED ORDER — METRONIDAZOLE 500 MG PO TABS: 500.0000 mg | ORAL_TABLET | Freq: Two times a day (BID) | ORAL | 0 refills | Status: AC

## 2024-02-03 NOTE — Progress Notes (Signed)

## 2024-02-23 ENCOUNTER — Telehealth: Payer: Self-pay

## 2024-02-23 DIAGNOSIS — I10 Essential (primary) hypertension: Secondary | ICD-10-CM

## 2024-02-23 NOTE — Progress Notes (Signed)
 Complex Care Management Note Care Guide Note  02/23/2024 Name: Janet Villanueva MRN: [id number] DOB: 29-Mar-1996   Complex Care Management Outreach Attempts: A second unsuccessful outreach was attempted today to offer the patient with information about available complex care management services.  Follow Up Plan:  Additional outreach attempts will be made to offer the patient complex care management information and services.   Encounter Outcome:  No Answer  Dreama [facility]  [facility], [facility] VBCI Assistant Direct Dial: [phone]  Fax: [phone]

## 2024-02-25 NOTE — Progress Notes (Signed)
 Complex Care Management Note Care Guide Note  02/25/2024 Name: Janet Villanueva MRN: [id number] DOB: [birth year]   Complex Care Management Outreach Attempts: A second unsuccessful outreach was attempted today to offer the patient with information about available complex care management services.  Follow Up Plan:  Additional outreach attempts will be made to offer the patient complex care management information and services.   Encounter Outcome:  No Answer  Dreama [facility]  [facility], [facility] VBCI Assistant Direct Dial: [phone]  Fax: [phone]

## 2024-03-01 ENCOUNTER — Ambulatory Visit: Admitting: Obstetrics & Gynecology

## 2024-03-01 NOTE — Progress Notes (Signed)
 Complex Care Management Note Care Guide Note  03/01/2024 Name: Janet Villanueva MRN: [id number] DOB: 1995/08/10   Complex Care Management Outreach Attempts: A third unsuccessful outreach was attempted today to offer the patient with information about available complex care management services.  Follow Up Plan:  No further outreach attempts will be made at this time. We have been unable to contact the patient to offer or enroll patient in complex care management services.  Encounter Outcome:  No Answer  Dreama [facility]  [facility], [facility] VBCI Assistant Direct Dial: [phone]  Fax: [phone]

## 2024-04-04 ENCOUNTER — Encounter: Payer: Self-pay | Admitting: Obstetrics & Gynecology

## 2024-04-04 ENCOUNTER — Ambulatory Visit: Payer: Self-pay | Admitting: Obstetrics & Gynecology

## 2024-04-04 ENCOUNTER — Other Ambulatory Visit (HOSPITAL_COMMUNITY)
Admission: RE | Admit: 2024-04-04 | Discharge: 2024-04-04 | Disposition: A | Discharge status: Home / Self Care | Source: Ambulatory Visit | Attending: Obstetrics & Gynecology | Admitting: Obstetrics & Gynecology

## 2024-04-04 VITALS — BP 121/81 | HR 92 | Wt 163.4 lb

## 2024-04-04 DIAGNOSIS — Z01419 Encounter for gynecological examination (general) (routine) without abnormal findings: Secondary | ICD-10-CM | POA: Diagnosis present

## 2024-04-04 DIAGNOSIS — Z113 Encounter for screening for infections with a predominantly sexual mode of transmission: Secondary | ICD-10-CM

## 2024-04-04 DIAGNOSIS — Z23 Encounter for immunization: Secondary | ICD-10-CM | POA: Diagnosis not present

## 2024-04-04 NOTE — Progress Notes (Signed)
 GYNECOLOGY ANNUAL PREVENTATIVE CARE ENCOUNTER NOTE  History:    Janet Villanueva is a 28 y.o. G1P1011 female here for a routine annual gynecologic exam.  Current complaints: none.  Desires annual pap and STI screen.   Denies abnormal vaginal bleeding, discharge, pelvic pain, problems with intercourse or other gynecologic concerns.  Gynecologic History No LMP recorded. Contraception: abstinence Last Pap: 12/23/2022. 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   FOB screening offered    Chronic hypertension with superimposed preeclampsia 06/16/2023   Hypertension    IBS (irritable bowel syndrome) 05/20/2015   Seasonal allergic rhinitis 05/30/2021    Past Surgical History:  Procedure Laterality Date   LAPAROSCOPIC OVARIAN CYSTECTOMY Left 12/11/2022   Procedure: LAPAROSCOPIC OVARIAN CYSTECTOMY AND DETORSION;  Surgeon: Verdon Keen, MD;  Location: [facility];  Service: Gynecology;  Laterality: Left;   LAPAROSCOPY Left 12/11/2022   Procedure: LAPAROSCOPY DIAGNOSTIC;  Surgeon: Verdon Keen, MD;  Location: [facility];  Service: Gynecology;  Laterality: Left;   TONSILLECTOMY     TONSILLECTOMY      Medications Ordered Prior to Encounter[1]  Allergies[2]  Social History:  reports that she has never smoked. She has never used smokeless tobacco. She reports that she does not currently use alcohol. She reports that she does not use drugs.  Family History  Problem Relation Age of Onset   Cancer Maternal Grandmother        breast   Diabetes Maternal Grandmother    Hypertension Maternal  Grandfather    Thyroid disease Mother    Asthma Sister    Asthma Brother     The following portions of the patient's history were reviewed and updated as appropriate: allergies, current medications, past family history, past medical history, past social history, past surgical history and problem list.  Review of Systems Pertinent items noted in HPI and remainder of comprehensive ROS otherwise negative.  Physical Exam:  BP 121/81   Pulse 92   Wt 163 lb 6 oz (74.1 kg)   BMI 27.19 kg/m  CONSTITUTIONAL: Well-developed, well-nourished female in no acute distress.  HENT:  Normocephalic, atraumatic, External right and left ear normal.  EYES: Conjunctivae and EOM are normal. Pupils are equal, round, and reactive to light. No scleral icterus.  NECK: Normal range of motion, supple, no masses observed. SKIN: Skin is warm and dry. No rash noted. Not diaphoretic. No erythema. No pallor. MUSCULOSKELETAL: Normal range of motion. No tenderness.  No cyanosis, clubbing, or edema. NEUROLOGIC: Alert and oriented to person, place, and time. Normal muscle tone coordination.  PSYCHIATRIC: Normal mood and affect. Normal behavior. Normal judgment and thought content. CARDIOVASCULAR: Normal heart rate noted, regular rhythm RESPIRATORY: Clear to auscultation bilaterally. Effort and breath sounds normal, no problems with respiration noted. BREASTS: Symmetric in size. No masses, tenderness, skin changes, nipple drainage, or lymphadenopathy bilaterally. Performed in the presence of a chaperone. ABDOMEN: Soft, no distention noted.  No tenderness,  rebound or guarding.  PELVIC: Normal appearing external genitalia and urethral meatus; normal appearing vaginal mucosa and cervix.  No abnormal vaginal discharge noted.  Pap smear obtained.  Normal uterine size, no other palpable masses, no uterine or adnexal tenderness.  Performed in the presence of a chaperone.  Assessment and Plan:     1. Flu vaccine need - Flu  vaccine trivalent PF, 6mos and older(Flulaval,Afluria,Fluarix,Fluzone ) given today  2. Routine screening for STI (sexually transmitted infection) STI screen done, will follow up results and manage accordingly. - Cytology - PAP ancillary testing - RPR+HBsAg+HCVAb+HIV  3. Well woman exam with routine gynecological exam (Primary) - Cytology - PAP Will follow up results of pap smear and manage accordingly. Normal breast examination today, she was advised to perform periodic self breast examinations.  Routine preventative health maintenance measures emphasized. Please refer to After Visit Summary for other counseling recommendations.      GLORIS HUGGER, MD, FACOG Obstetrician & Gynecologist, [facility] for [employer], [facility]     [1]  Current Outpatient Medications on File Prior to Visit  Medication Sig Dispense Refill   labetalol  (NORMODYNE ) 200 MG tablet TAKE 1 TABLET BY MOUTH TWICE A DAY (Patient not taking: Reported on 04/04/2024) 180 tablet 1   Prenatal Vit-Fe Fumarate-FA (PRENATAL MULTIVITAMIN) TABS tablet Take 1 tablet by mouth daily at 12 noon. (Patient not taking: Reported on 04/04/2024)     No current facility-administered medications on file prior to visit.  [2] No Known Allergies

## 2024-04-05 ENCOUNTER — Ambulatory Visit: Payer: Self-pay | Admitting: Obstetrics & Gynecology

## 2024-04-05 LAB — RPR+HBSAG+HCVAB+...
HIV Screen 4th Generation wRfx: NONREACTIVE
Hep C Virus Ab: NONREACTIVE
Hepatitis B Surface Ag: NEGATIVE
RPR Ser Ql: NONREACTIVE

## 2024-04-10 LAB — CYTOLOGY - PAP
Chlamydia: NEGATIVE
Comment: NEGATIVE
Comment: NEGATIVE
Comment: NEGATIVE
Comment: NORMAL
Diagnosis: NEGATIVE
High risk HPV: NEGATIVE
Neisseria Gonorrhea: NEGATIVE
Trichomonas: NEGATIVE
